# Patient Record
Sex: Female | Born: 1970 | Race: White | Hispanic: No | Marital: Single | Smoking: Never smoker
Health system: Southern US, Community
[De-identification: ages and names within clinical notes are randomized; demographics above are authoritative.]

## PROBLEM LIST (undated history)

## (undated) DIAGNOSIS — K760 Fatty (change of) liver, not elsewhere classified: Secondary | ICD-10-CM

## (undated) DIAGNOSIS — N809 Endometriosis, unspecified: Secondary | ICD-10-CM

## (undated) DIAGNOSIS — G43909 Migraine, unspecified, not intractable, without status migrainosus: Secondary | ICD-10-CM

## (undated) DIAGNOSIS — F419 Anxiety disorder, unspecified: Secondary | ICD-10-CM

## (undated) DIAGNOSIS — J101 Influenza due to other identified influenza virus with other respiratory manifestations: Secondary | ICD-10-CM

## (undated) DIAGNOSIS — A419 Sepsis, unspecified organism: Secondary | ICD-10-CM

## (undated) DIAGNOSIS — T7840XA Allergy, unspecified, initial encounter: Secondary | ICD-10-CM

## (undated) DIAGNOSIS — G4485 Primary stabbing headache: Secondary | ICD-10-CM

## (undated) HISTORY — PX: TYMPANOSTOMY TUBE PLACEMENT: SHX32

## (undated) HISTORY — DX: Sepsis, unspecified organism: A41.9

## (undated) HISTORY — DX: Fatty (change of) liver, not elsewhere classified: K76.0

## (undated) HISTORY — DX: Allergy, unspecified, initial encounter: T78.40XA

## (undated) HISTORY — DX: Anxiety disorder, unspecified: F41.9

## (undated) HISTORY — PX: SIGMOIDOSCOPY: SUR1295

## (undated) HISTORY — DX: Primary stabbing headache: G44.85

## (undated) HISTORY — DX: Influenza due to other identified influenza virus with other respiratory manifestations: J10.1

## (undated) HISTORY — DX: Migraine, unspecified, not intractable, without status migrainosus: G43.909

## (undated) HISTORY — PX: BREAST SURGERY: SHX581

## (undated) HISTORY — PX: OTHER SURGICAL HISTORY: SHX169

## (undated) HISTORY — PX: COLONOSCOPY: SHX174

## (undated) HISTORY — DX: Endometriosis, unspecified: N80.9

---

## 1992-02-25 HISTORY — PX: BREAST EXCISIONAL BIOPSY: SUR124

## 2013-01-26 ENCOUNTER — Other Ambulatory Visit (HOSPITAL_COMMUNITY)
Admission: RE | Admit: 2013-01-26 | Discharge: 2013-01-26 | Disposition: A | Discharge status: Home / Self Care | Payer: BC Managed Care – PPO | Source: Ambulatory Visit | Attending: Internal Medicine | Admitting: Internal Medicine

## 2013-01-26 DIAGNOSIS — Z1151 Encounter for screening for human papillomavirus (HPV): Secondary | ICD-10-CM | POA: Insufficient documentation

## 2013-01-26 DIAGNOSIS — Z01419 Encounter for gynecological examination (general) (routine) without abnormal findings: Secondary | ICD-10-CM | POA: Insufficient documentation

## 2013-10-10 ENCOUNTER — Other Ambulatory Visit: Payer: Self-pay | Admitting: Internal Medicine

## 2013-10-10 DIAGNOSIS — R1011 Right upper quadrant pain: Secondary | ICD-10-CM

## 2013-10-11 ENCOUNTER — Ambulatory Visit
Admission: RE | Admit: 2013-10-11 | Discharge: 2013-10-11 | Disposition: A | Discharge status: Home / Self Care | Payer: BC Managed Care – PPO | Source: Ambulatory Visit | Attending: Internal Medicine | Admitting: Internal Medicine

## 2013-10-11 ENCOUNTER — Other Ambulatory Visit: Payer: Self-pay | Admitting: Internal Medicine

## 2013-10-11 DIAGNOSIS — R1011 Right upper quadrant pain: Secondary | ICD-10-CM

## 2014-02-02 ENCOUNTER — Other Ambulatory Visit: Payer: Self-pay | Admitting: Internal Medicine

## 2014-02-02 ENCOUNTER — Other Ambulatory Visit (HOSPITAL_COMMUNITY)
Admission: RE | Admit: 2014-02-02 | Discharge: 2014-02-02 | Disposition: A | Discharge status: Home / Self Care | Payer: BC Managed Care – PPO | Source: Ambulatory Visit | Attending: Internal Medicine | Admitting: Internal Medicine

## 2014-02-02 DIAGNOSIS — Z1151 Encounter for screening for human papillomavirus (HPV): Secondary | ICD-10-CM | POA: Diagnosis present

## 2014-02-02 DIAGNOSIS — Z01419 Encounter for gynecological examination (general) (routine) without abnormal findings: Secondary | ICD-10-CM | POA: Insufficient documentation

## 2014-02-06 LAB — CYTOLOGY - PAP

## 2014-02-15 ENCOUNTER — Ambulatory Visit
Admission: RE | Admit: 2014-02-15 | Discharge: 2014-02-15 | Disposition: A | Discharge status: Home / Self Care | Payer: BC Managed Care – PPO | Source: Ambulatory Visit | Attending: Otolaryngology | Admitting: Otolaryngology

## 2014-02-15 ENCOUNTER — Other Ambulatory Visit: Payer: Self-pay | Admitting: Otolaryngology

## 2014-02-15 DIAGNOSIS — R51 Headache: Principal | ICD-10-CM

## 2014-02-15 DIAGNOSIS — R519 Headache, unspecified: Secondary | ICD-10-CM

## 2014-02-15 MED ORDER — GADOBENATE DIMEGLUMINE 529 MG/ML IV SOLN
12.0000 mL | Freq: Once | INTRAVENOUS | Status: AC | PRN
  Administered 2014-02-15: 12 mL via INTRAVENOUS

## 2014-04-11 ENCOUNTER — Encounter (HOSPITAL_COMMUNITY): Payer: Self-pay | Admitting: Emergency Medicine

## 2014-04-11 ENCOUNTER — Emergency Department (HOSPITAL_COMMUNITY): Payer: 59

## 2014-04-11 ENCOUNTER — Inpatient Hospital Stay (HOSPITAL_COMMUNITY)
Admission: EM | Admit: 2014-04-11 | Discharge: 2014-04-12 | DRG: 872 | Disposition: A | Discharge status: Home / Self Care | Payer: 59 | Attending: Internal Medicine | Admitting: Internal Medicine

## 2014-04-11 DIAGNOSIS — R509 Fever, unspecified: Secondary | ICD-10-CM | POA: Diagnosis not present

## 2014-04-11 DIAGNOSIS — L539 Erythematous condition, unspecified: Secondary | ICD-10-CM | POA: Diagnosis present

## 2014-04-11 DIAGNOSIS — Z885 Allergy status to narcotic agent status: Secondary | ICD-10-CM

## 2014-04-11 DIAGNOSIS — J101 Influenza due to other identified influenza virus with other respiratory manifestations: Secondary | ICD-10-CM | POA: Diagnosis present

## 2014-04-11 DIAGNOSIS — A419 Sepsis, unspecified organism: Secondary | ICD-10-CM

## 2014-04-11 DIAGNOSIS — IMO0001 Reserved for inherently not codable concepts without codable children: Secondary | ICD-10-CM | POA: Insufficient documentation

## 2014-04-11 HISTORY — DX: Sepsis, unspecified organism: A41.9

## 2014-04-11 LAB — CBC WITH DIFFERENTIAL/PLATELET
BASOS PCT: 0 % (ref 0–1)
Basophils Absolute: 0 10*3/uL (ref 0.0–0.1)
Eosinophils Absolute: 0 10*3/uL (ref 0.0–0.7)
Eosinophils Relative: 0 % (ref 0–5)
HCT: 35.8 % — ABNORMAL LOW (ref 36.0–46.0)
HEMOGLOBIN: 12.5 g/dL (ref 12.0–15.0)
LYMPHS ABS: 0.7 10*3/uL (ref 0.7–4.0)
Lymphocytes Relative: 16 % (ref 12–46)
MCH: 30 pg (ref 26.0–34.0)
MCHC: 34.9 g/dL (ref 30.0–36.0)
MCV: 85.9 fL (ref 78.0–100.0)
MONO ABS: 0.3 10*3/uL (ref 0.1–1.0)
Monocytes Relative: 7 % (ref 3–12)
NEUTROS ABS: 3.4 10*3/uL (ref 1.7–7.7)
Neutrophils Relative %: 77 % (ref 43–77)
Platelets: 181 10*3/uL (ref 150–400)
RBC: 4.17 MIL/uL (ref 3.87–5.11)
RDW: 12.2 % (ref 11.5–15.5)
WBC: 4.4 10*3/uL (ref 4.0–10.5)

## 2014-04-11 LAB — URINALYSIS, ROUTINE W REFLEX MICROSCOPIC
Bilirubin Urine: NEGATIVE
GLUCOSE, UA: NEGATIVE mg/dL
Leukocytes, UA: NEGATIVE
NITRITE: NEGATIVE
PH: 7.5 (ref 5.0–8.0)
Protein, ur: NEGATIVE mg/dL
SPECIFIC GRAVITY, URINE: 1.007 (ref 1.005–1.030)
Urobilinogen, UA: 0.2 mg/dL (ref 0.0–1.0)

## 2014-04-11 LAB — COMPREHENSIVE METABOLIC PANEL
ALK PHOS: 49 U/L (ref 39–117)
ALT: 15 U/L (ref 0–35)
ANION GAP: 9 (ref 5–15)
AST: 23 U/L (ref 0–37)
Albumin: 4.3 g/dL (ref 3.5–5.2)
BILIRUBIN TOTAL: 1.3 mg/dL — AB (ref 0.3–1.2)
CO2: 24 mmol/L (ref 19–32)
Calcium: 9 mg/dL (ref 8.4–10.5)
Chloride: 103 mmol/L (ref 96–112)
Creatinine, Ser: 1.12 mg/dL — ABNORMAL HIGH (ref 0.50–1.10)
GFR calc Af Amer: 69 mL/min — ABNORMAL LOW (ref >90)
GFR calc non Af Amer: 60 mL/min — ABNORMAL LOW (ref >90)
GLUCOSE: 104 mg/dL — AB (ref 70–99)
Potassium: 3.7 mmol/L (ref 3.5–5.1)
SODIUM: 136 mmol/L (ref 135–145)
TOTAL PROTEIN: 7.5 g/dL (ref 6.0–8.3)

## 2014-04-11 LAB — APTT: aPTT: 35 seconds (ref 24–37)

## 2014-04-11 LAB — PROTIME-INR
INR: 1.21 (ref 0.00–1.49)
Prothrombin Time: 15.5 seconds — ABNORMAL HIGH (ref 11.6–15.2)

## 2014-04-11 LAB — I-STAT CG4 LACTIC ACID, ED
LACTIC ACID, VENOUS: 0.99 mmol/L (ref 0.5–2.0)
Lactic Acid, Venous: 2.79 mmol/L (ref 0.5–2.0)

## 2014-04-11 LAB — INFLUENZA PANEL BY PCR (TYPE A & B)
H1N1FLUPCR: NOT DETECTED
INFLBPCR: POSITIVE — AB
Influenza A By PCR: NEGATIVE

## 2014-04-11 LAB — URINE MICROSCOPIC-ADD ON

## 2014-04-11 LAB — RAPID STREP SCREEN (MED CTR MEBANE ONLY): Streptococcus, Group A Screen (Direct): NEGATIVE

## 2014-04-11 LAB — LIPASE, BLOOD: Lipase: 31 U/L (ref 11–59)

## 2014-04-11 LAB — LACTIC ACID, PLASMA: LACTIC ACID, VENOUS: 1 mmol/L (ref 0.5–2.0)

## 2014-04-11 LAB — PROCALCITONIN: Procalcitonin: <0.1 ng/mL

## 2014-04-11 MED ORDER — PIPERACILLIN-TAZOBACTAM 3.375 G IVPB 30 MIN
3.3750 g | Freq: Once | INTRAVENOUS | Status: AC
  Administered 2014-04-11: 3.375 g via INTRAVENOUS
  Filled 2014-04-11: qty 50

## 2014-04-11 MED ORDER — METHYLPREDNISOLONE SODIUM SUCC 125 MG IJ SOLR
125.0000 mg | Freq: Once | INTRAMUSCULAR | Status: AC
  Administered 2014-04-11: 125 mg via INTRAVENOUS
  Filled 2014-04-11: qty 2

## 2014-04-11 MED ORDER — SODIUM CHLORIDE 0.9 % IV SOLN
INTRAVENOUS | Status: DC
  Administered 2014-04-11 – 2014-04-12 (×2): via INTRAVENOUS

## 2014-04-11 MED ORDER — ACETAMINOPHEN 325 MG PO TABS
650.0000 mg | ORAL_TABLET | Freq: Four times a day (QID) | ORAL | Status: DC | PRN
  Administered 2014-04-11 – 2014-04-12 (×3): 650 mg via ORAL
  Filled 2014-04-11 (×3): qty 2

## 2014-04-11 MED ORDER — SODIUM CHLORIDE 0.9 % IV SOLN: INTRAVENOUS | Status: DC

## 2014-04-11 MED ORDER — ACETAMINOPHEN 650 MG RE SUPP: 650.0000 mg | Freq: Four times a day (QID) | RECTAL | Status: DC | PRN

## 2014-04-11 MED ORDER — VANCOMYCIN HCL IN DEXTROSE 1-5 GM/200ML-% IV SOLN
1000.0000 mg | Freq: Once | INTRAVENOUS | Status: AC
  Administered 2014-04-11: 1000 mg via INTRAVENOUS
  Filled 2014-04-11: qty 200

## 2014-04-11 MED ORDER — DIPHENHYDRAMINE HCL 50 MG/ML IJ SOLN
50.0000 mg | Freq: Once | INTRAMUSCULAR | Status: AC
Start: 2014-04-11 — End: 2014-04-11
  Administered 2014-04-11: 50 mg via INTRAVENOUS
  Filled 2014-04-11: qty 1

## 2014-04-11 MED ORDER — ACETAMINOPHEN 325 MG PO TABS
650.0000 mg | ORAL_TABLET | Freq: Once | ORAL | Status: AC
  Administered 2014-04-11: 650 mg via ORAL
  Filled 2014-04-11: qty 2

## 2014-04-11 MED ORDER — FAMOTIDINE IN NACL 20-0.9 MG/50ML-% IV SOLN
20.0000 mg | Freq: Once | INTRAVENOUS | Status: AC
  Administered 2014-04-11: 20 mg via INTRAVENOUS
  Filled 2014-04-11: qty 50

## 2014-04-11 MED ORDER — GUAIFENESIN-DM 100-10 MG/5ML PO SYRP
5.0000 mL | ORAL_SOLUTION | ORAL | Status: DC | PRN
  Administered 2014-04-12: 5 mL via ORAL
  Filled 2014-04-11 (×2): qty 5

## 2014-04-11 MED ORDER — ONDANSETRON HCL 4 MG/2ML IJ SOLN: 4.0000 mg | Freq: Four times a day (QID) | INTRAMUSCULAR | Status: DC | PRN

## 2014-04-11 MED ORDER — SODIUM CHLORIDE 0.9 % IV BOLUS (SEPSIS)
1000.0000 mL | INTRAVENOUS | Status: AC
  Administered 2014-04-11 (×2): 1000 mL via INTRAVENOUS

## 2014-04-11 MED ORDER — PIPERACILLIN-TAZOBACTAM 3.375 G IVPB
3.3750 g | Freq: Three times a day (TID) | INTRAVENOUS | Status: DC
  Administered 2014-04-11 – 2014-04-12 (×2): 3.375 g via INTRAVENOUS
  Filled 2014-04-11 (×5): qty 50

## 2014-04-11 MED ORDER — VANCOMYCIN HCL IN DEXTROSE 750-5 MG/150ML-% IV SOLN
750.0000 mg | Freq: Two times a day (BID) | INTRAVENOUS | Status: DC
  Administered 2014-04-12: 750 mg via INTRAVENOUS
  Filled 2014-04-11 (×2): qty 150

## 2014-04-11 MED ORDER — OSELTAMIVIR PHOSPHATE 75 MG PO CAPS
75.0000 mg | ORAL_CAPSULE | Freq: Two times a day (BID) | ORAL | Status: DC
  Administered 2014-04-11 – 2014-04-12 (×2): 75 mg via ORAL
  Filled 2014-04-11 (×4): qty 1

## 2014-04-11 MED ORDER — SODIUM CHLORIDE 0.9 % IJ SOLN
3.0000 mL | Freq: Two times a day (BID) | INTRAMUSCULAR | Status: DC
  Administered 2014-04-12: 3 mL via INTRAVENOUS

## 2014-04-11 MED ORDER — ENOXAPARIN SODIUM 40 MG/0.4ML ~~LOC~~ SOLN
40.0000 mg | SUBCUTANEOUS | Status: DC
  Filled 2014-04-11 (×2): qty 0.4

## 2014-04-11 MED ORDER — ONDANSETRON HCL 4 MG PO TABS: 4.0000 mg | ORAL_TABLET | Freq: Four times a day (QID) | ORAL | Status: DC | PRN

## 2014-04-11 NOTE — ED Notes (Signed)
Phlebotomy at bedside.

## 2014-04-11 NOTE — Progress Notes (Addendum)
ANTIBIOTIC CONSULT NOTE - INITIAL  Pharmacy Consult for Vancomycin and Zosyn Indication: rule out sepsis  Allergies  Allergen Reactions  . Codeine     Patient Measurements: Height: 5' (152.4 cm) Weight: 132 lb (59.875 kg) IBW/kg (Calculated) : 45.5 Adjusted Body Weight:   Vital Signs: Temp: 101.2 F (38.4 C) (02/16 1202) Temp Source: Oral (02/16 1202) BP: 113/66 mmHg (02/16 1300) Pulse Rate: 109 (02/16 1300) Intake/Output from previous day:   Intake/Output from this shift:    Labs:  Recent Labs  04/11/14 1209  WBC 4.4  HGB 12.5  PLT 181   CrCl cannot be calculated (Patient has no serum creatinine result on file.). No results for input(s): VANCOTROUGH, VANCOPEAK, VANCORANDOM, GENTTROUGH, GENTPEAK, GENTRANDOM, TOBRATROUGH, TOBRAPEAK, TOBRARND, AMIKACINPEAK, AMIKACINTROU, AMIKACIN in the last 72 hours.   Microbiology: Recent Results (from the past 720 hour(s))  Rapid strep screen     Status: None   Collection Time: 04/11/14 12:04 PM  Result Value Ref Range Status   Streptococcus, Group A Screen (Direct) NEGATIVE NEGATIVE Final    Comment: (NOTE) A Rapid Antigen test may result negative if the antigen level in the sample is below the detection level of this test. The FDA has not cleared this test as a stand-alone test therefore the rapid antigen negative result has reflexed to a Group A Strep culture.     Medical History: History reviewed. No pertinent past medical history.  Medications:  Scheduled:   Assessment: 44yo female presenting with body aches, fever, cough & sore throat- to start Vancomycin and Zosyn for R/O sepsis.  Vancomycin 1000mg  IV and Zosyn 3.375g IV have both been ordered as first doses.  Cr 1.12 LA 2.79 WBC 4.4  Goal of Therapy:  Vancomycin trough level 15-20 mcg/ml  Plan:  Zosyn 3.375g IV q8, infuse over 4hr Vancomycin 750mg  IV q12 F/U culture results   Marisue HumbleKendra Karena Kinker, PharmD Clinical Pharmacist [facility]

## 2014-04-11 NOTE — ED Notes (Signed)
Phlebotomy notified of need for Blood cultures. Xray at bedside.

## 2014-04-11 NOTE — ED Notes (Signed)
Dr. Pfeiffer back at the bedside.  

## 2014-04-11 NOTE — ED Notes (Signed)
Level 2 Sepsis activated @ 1305

## 2014-04-11 NOTE — ED Notes (Signed)
Lactic acid results shown to Ward, DO and given to the charge nurse

## 2014-04-11 NOTE — ED Provider Notes (Signed)
CSN: [id number]     Arrival date & time 04/11/14  1153 History   First MD Initiated Contact with Patient 04/11/14 1256     Chief Complaint  Patient presents with  . Generalized Body Aches  . Fever  . Cough     (Consider location/radiation/quality/duration/timing/severity/associated sxs/prior Treatment) HPI The patient is sent from Dr. Idelle Crouch office from Day. He called and reported concern for sepsis with the patient having hypotension and fever. The patient reports that she was first sick about 2 weeks ago. She reports she had abdominal pain for about a week but did not develop any vomiting. She reports she had a couple episodes of diarrhea but not any significant amount. She reports she was able to continue to eat and drink. She felt better by last week and it seemed to resolve. Now she reports about 3 days ago she started developing a sore throat and then last night harsh cough nonproductive of sputum. She reports now that she aches all over and has had a fever up to 103 and chills. She has not developed any vomiting or diarrhea. History reviewed. No pertinent past medical history. History reviewed. No pertinent past surgical history. History reviewed. No pertinent family history. History  Substance Use Topics  . Smoking status: Never Smoker   . Smokeless tobacco: Not on file  . Alcohol Use: Yes     Comment: occ   OB History    No data available     Review of Systems 10 Systems reviewed and are negative for acute change except as noted in the HPI.    Allergies  Codeine  Home Medications   Prior to Admission medications   Medication Sig Start Date End Date Taking? Authorizing Provider  naproxen (NAPROSYN) 375 MG tablet Take 375 mg by mouth 2 (two) times daily as needed for mild pain.   Yes Historical Provider, MD   BP 95/57 mmHg  Pulse 98  Temp(Src) 101.2 F (38.4 C) (Oral)  Resp 12  Ht 5' (1.524 m)  Wt 132 lb (59.875 kg)  BMI 25.78 kg/m2  SpO2 99%  LMP  04/11/2014 Physical Exam  Constitutional: She is oriented to person, place, and time. She appears well-developed and well-nourished.  The patient appears mildly ill and uncomfortable. She is alert and appropriate. She does not have any respiratory distress. Her color is good.  HENT:  Head: Normocephalic and atraumatic.  Bilateral TMs are normal. Throat mild erythema but no exudate or significant tonsillar enlargement. Neck is supple.  Eyes: EOM are normal. Pupils are equal, round, and reactive to light.  Neck: Neck supple.  Cardiovascular: Regular rhythm, normal heart sounds and intact distal pulses.   Tachycardia  Pulmonary/Chest: Effort normal and breath sounds normal.  Abdominal: Soft. Bowel sounds are normal. She exhibits no distension. There is no tenderness.  Musculoskeletal: Normal range of motion. She exhibits no edema.  Neurological: She is alert and oriented to person, place, and time. She has normal strength. Coordination normal. GCS eye subscore is 4. GCS verbal subscore is 5. GCS motor subscore is 6.  Skin: Skin is warm, dry and intact.  Refill is slightly delayed in the feet. There are no rashes over any of the body surfaces.  Psychiatric: She has a normal mood and affect.    ED Course  Procedures (including critical care time) Labs Review Labs Reviewed  CBC WITH DIFFERENTIAL/PLATELET - Abnormal; Notable for the following:    HCT 35.8 (*)    All other components within  normal limits  COMPREHENSIVE METABOLIC PANEL - Abnormal; Notable for the following:    Glucose, Bld 104 (*)    BUN <5 (*)    Creatinine, Ser 1.12 (*)    Total Bilirubin 1.3 (*)    GFR calc non Af Amer 60 (*)    GFR calc Af Amer 69 (*)    All other components within normal limits  URINALYSIS, ROUTINE W REFLEX MICROSCOPIC - Abnormal; Notable for the following:    Hgb urine dipstick LARGE (*)    Ketones, ur >80 (*)    All other components within normal limits  I-STAT CG4 LACTIC ACID, ED - Abnormal;  Notable for the following:    Lactic Acid, Venous 2.79 (*)    All other components within normal limits  RAPID STREP SCREEN  CULTURE, GROUP A STREP  CULTURE, BLOOD (ROUTINE X 2)  CULTURE, BLOOD (ROUTINE X 2)  URINE CULTURE  APTT  LIPASE, BLOOD  URINE MICROSCOPIC-ADD ON  INFLUENZA PANEL BY PCR (TYPE A & B, H1N1)  I-STAT CG4 LACTIC ACID, ED    Imaging Review Dg Chest Port 1 View  04/11/2014   CLINICAL DATA:  Cough and fever for 2 days  EXAM: PORTABLE CHEST - 1 VIEW  COMPARISON:  None.  FINDINGS: The heart size and mediastinal contours are within normal limits. Both lungs are clear. The visualized skeletal structures are unremarkable.  IMPRESSION: No active disease.   Electronically Signed   By: Alcide CleverMark  Lukens M.D.   On: 04/11/2014 13:33     EKG Interpretation None     Recheck 15:00. The patient's general condition is stable she is alert with no respiratory distress. There has been no decline in her general condition. She has developed erythema after the administration of antibiotics. She perceives her head and back to be very itchy and there is diffuse erythema over the backs of the shoulders and her back. Arms and anterior aspect of the body and legs are spared.  CRITICAL CARE Performed by: Arby BarrettePfeiffer, Stephonie Wilcoxen   Total critical care time: 40  Critical care time was exclusive of separately billable procedures and treating other patients.  Critical care was necessary to treat or prevent imminent or life-threatening deterioration.  Critical care was time spent personally by me on the following activities: development of treatment plan with patient and/or surrogate as well as nursing, discussions with consultants, evaluation of patient's response to treatment, examination of patient, obtaining history from patient or surrogate, ordering and performing treatments and interventions, ordering and review of laboratory studies, ordering and review of radiographic studies, pulse oximetry and  re-evaluation of patient's condition. MDM   Final diagnoses:  Sepsis, due to unspecified organism   The patient's clinical condition remains stable with a clear mental status and no respiratory distress. She appears to have developed allergic reaction to either Zosyn or vancomycin, or possibly the beginning of red man syndrome. She was treated with Solu-Medrol, Benadryl and Pepcid. She will be admitted to telemetry for ongoing treatment and monitoring.   Arby BarretteMarcy Tomoko Sandra, MD 04/11/14 [phone]

## 2014-04-11 NOTE — ED Notes (Signed)
Pt sts body aches, fever, cough and sore throat; pt sts had stomach virus last week; pt hyperventilating and c/o generalized numbness

## 2014-04-11 NOTE — H&P (Addendum)
[facility] History and Physical  Andres Labrumudrey Hickmon KGM:[id number]:[id number] DOB: 06/23/1970 DOA: 04/11/2014  Referring physician:  Dr Clarice PolePfeifer  PCP: Dr Molly Maduroobert gates  Chief Complaint:  Sent from PCP office for sepsis w/up   HPI:  44 year old female with no past medical history was sent from PCP office to the ED for sepsis workup. Patient reports having stomach ache for almost 1 week with abdominal cramping and bloating with loose bowel movements until 5 days and subsided on its on. 3 days back she developed sore throat with nonproductive cough and congestion. She also reports postnasal drip and poor appetite. Denies headache or blurred vision, dysphagia, nausea, vomiting, chest pain, palpitations, shortness of breath, dysuria or diarrhea. Denies joint pains or vaginal discharge. She reports having subjective fevers with chills yesterday and this morning when she recorded her temperature it was 1026F. He also had some mid to low back pain yesterday with generalized malaise. She went to the PCP office this morning and was found to be febrile and tachycardic and was referred to the ED. She denies any recent travel or sick contact reports that her dog was sick with diarrhea last week as well. Reports monogamous  Relationship for past 12 years. No hx of STDs.  Course in the ED  patient was febrile to 101.26F, tachycardic up to 117, respiratory rate of 222 and low normal blood pressure of 95/57 mmHg. O2 sats were normal on room air. Blood work done showed normal CBC and chemistry except for creatinine 1.12. LFTs were normal. Chest x-ray was unremarkable. UA was negative for UTI. Lactic acid was elevated to 2.79. Patient meets criteria for sepsis.  She was given 2 L IV normal saline in the ED. Blood cultures were ordered and patient given empiric IV vancomycin and Zosyn. Shortly after receiving the IV antibiotic patient developed diffuse erythema over the upper back with itching. She was then given 1.5 mg IV  Solu-Medrol, famotidine and IV Benadryl after which her symptoms improved. Hospitalist admission requested to telemetry.   Review of Systems:  Constitutional:  fever, chills, appetite change and fatigue.  HEENT: Denies photophobia, eye pain, redness, hearing loss, ear pain,  congestion++, sore throat++,  denies rhinorrhea, sneezing, mouth sores, trouble swallowing, neck pain, neck stiffness and tinnitus.   Respiratory: cough++, Denies SOB, DOE,  chest tightness,  and wheezing.   Cardiovascular: Denies chest pain, palpitations and leg swelling.  Gastrointestinal: Denies nausea, vomiting, abdominal pain, diarrhea, constipation, blood in stool and abdominal distention.  Genitourinary: Denies dysuria, urgency, frequency, hematuria, flank pain and difficulty urinating.  Endocrine: Denies: hot or cold intolerance, polyuria, polydipsia. Musculoskeletal:  myalgias++,  denies back pain, joint swelling, arthralgias and gait problem.  Skin: Denies pallor, rash and wound.  Neurological: Denies dizziness, seizures, syncope, weakness, light-headedness, numbness and headaches.  Hematological: Denies adenopathy.  Psychiatric/Behavioral: Denies confusion  History reviewed. No pertinent past medical history.  History reviewed. No pertinent past surgical history.   Family history: No history of heart disease, hypertension or diabetes in the family.  Social History:  reports that she has never smoked. She does not have any smokeless tobacco history on file. She reports that she drinks alcohol. She reports using vaporized marijuana   Allergies  Allergen Reactions  . Codeine Nausea And Vomiting      Prior to Admission medications   Medication Sig Start Date End Date Taking? Authorizing Provider  naproxen (NAPROSYN) 375 MG tablet Take 375 mg by mouth 2 (two) times daily as needed for mild pain.  Yes Historical Provider, MD     Physical Exam:  Filed Vitals:   04/11/14 1315 04/11/14 1330 04/11/14  1430 04/11/14 1445  BP: 127/111 114/63 100/58 95/57  Pulse: 102 110 98 98  Temp:      TempSrc:      Resp: Height:      Weight:      SpO2: 100% 97% 99% 99%    Constitutional: Vital signs reviewed.  Middle aged female lying in bed in no acute distress but appears fatigued HEENT: no pallor, no icterus, moist oral mucosa, no cervical lymphadenopathy, mild posterior pharyngeal erythema without exudates, no oral thrush Cardiovascular: RRR, S1 normal, S2 normal, no MRG Chest: CTAB, no wheezes, rales, or rhonchi Abdominal: Soft. Non-tender, non-distended, bowel sounds are normal, no masses, organomegaly, or guarding present.  GU: no CVA tenderness Ext: warm, no edema Neurological: Alert and oriented, non focal  Labs on Admission:  Basic Metabolic Panel:  Recent Labs Lab 04/11/14 1209  NA 136  K 3.7  CL 103  CO2 24  GLUCOSE 104*  BUN <5*  CREATININE 1.12*  CALCIUM 9.0   Liver Function Tests:  Recent Labs Lab 04/11/14 1209  AST 23  ALT 15  ALKPHOS 49  BILITOT 1.3*  PROT 7.5  ALBUMIN 4.3    Recent Labs Lab 04/11/14 1344  LIPASE 31   No results for input(s): AMMONIA in the last 168 hours. CBC:  Recent Labs Lab 04/11/14 1209  WBC 4.4  NEUTROABS 3.4  HGB 12.5  HCT 35.8*  MCV 85.9  PLT 181   Cardiac Enzymes: No results for input(s): CKTOTAL, CKMB, CKMBINDEX, TROPONINI in the last 168 hours. BNP: Invalid input(s): POCBNP CBG: No results for input(s): GLUCAP in the last 168 hours.  Radiological Exams on Admission: Dg Chest Port 1 View  04/11/2014   CLINICAL DATA:  Cough and fever for 2 days  EXAM: PORTABLE CHEST - 1 VIEW  COMPARISON:  None.  FINDINGS: The heart size and mediastinal contours are within normal limits. Both lungs are clear. The visualized skeletal structures are unremarkable.  IMPRESSION: No active disease.   Electronically Signed   By: Alcide Clever M.D.   On: 04/11/2014 13:33    EKG: None  Assessment/Plan Principal  Problem:    Sepsis No clear source of infection. Initial workup including UA and chest x-ray unremarkable. WBC normal. Possibly related to an acute viral URI. Blood cultures ordered in the ED. Patient given empiric IV vancomycin and Zosyn but developed a diffuse erythematous rash on the back. Could be related to vancomycin. I would monitor her on empiric IV Zosyn alone. -Check repeat lactic acid. IV hydration with normal saline 1 25 mL per hour. (Received 2 L normal saline bolus in the ED) -Check procalcitonin -Rapid strep negative. -Check flu PCR. -Supportive care with Tylenol, IV fluids , antitussives and antiemetics.   Rash Developed erythematous rash over the upper back and neck Shortly after receiving IV antibiotics. ? Red man syndrome. Had resolved during my evaluation after receiving Solu-Medrol, Benadryl and Pepcid.  Diet:cardiac  DVT prophylaxis: sq lovenox   Code Status: Full code  Family Communication: None at bedside  Disposition Plan: Admit to telemetry  Chivonne Rascon, [facility] Pager [phone]  Total time spent on admission :70 minutes  If 7PM-7AM, please contact night-coverage [email or web address] Password [email or web address] 04/11/2014, 3:20 PM

## 2014-04-11 NOTE — ED Notes (Signed)
Dr. Pfeiffer at bedside   

## 2014-04-12 DIAGNOSIS — A419 Sepsis, unspecified organism: Principal | ICD-10-CM

## 2014-04-12 DIAGNOSIS — J101 Influenza due to other identified influenza virus with other respiratory manifestations: Secondary | ICD-10-CM

## 2014-04-12 HISTORY — DX: Influenza due to other identified influenza virus with other respiratory manifestations: J10.1

## 2014-04-12 LAB — CBC
HCT: 31.6 % — ABNORMAL LOW (ref 36.0–46.0)
Hemoglobin: 11.1 g/dL — ABNORMAL LOW (ref 12.0–15.0)
MCH: 30.5 pg (ref 26.0–34.0)
MCHC: 35.1 g/dL (ref 30.0–36.0)
MCV: 86.8 fL (ref 78.0–100.0)
PLATELETS: 151 10*3/uL (ref 150–400)
RBC: 3.64 MIL/uL — ABNORMAL LOW (ref 3.87–5.11)
RDW: 12.4 % (ref 11.5–15.5)
WBC: 4.5 10*3/uL (ref 4.0–10.5)

## 2014-04-12 LAB — BASIC METABOLIC PANEL
Anion gap: 9 (ref 5–15)
BUN: 8 mg/dL (ref 6–23)
CO2: 22 mmol/L (ref 19–32)
Calcium: 8.1 mg/dL — ABNORMAL LOW (ref 8.4–10.5)
Chloride: 110 mmol/L (ref 96–112)
Creatinine, Ser: 0.81 mg/dL (ref 0.50–1.10)
GFR calc Af Amer: >90 mL/min (ref >90)
GFR calc non Af Amer: 88 mL/min — ABNORMAL LOW (ref >90)
GLUCOSE: 139 mg/dL — AB (ref 70–99)
Potassium: 3.7 mmol/L (ref 3.5–5.1)
SODIUM: 141 mmol/L (ref 135–145)

## 2014-04-12 LAB — URINE CULTURE: Colony Count: >=100000

## 2014-04-12 MED ORDER — PHENOL 1.4 % MT LIQD
1.0000 | OROMUCOSAL | Status: DC | PRN
  Filled 2014-04-12: qty 177

## 2014-04-12 MED ORDER — OSELTAMIVIR PHOSPHATE 75 MG PO CAPS: 75.0000 mg | ORAL_CAPSULE | Freq: Two times a day (BID) | ORAL | Status: DC

## 2014-04-12 NOTE — Progress Notes (Signed)
PROGRESS NOTE  Jill Melendez ZTI:[id number] DOB: [birth year] DOA: 04/11/2014 PCP: Henrine Screws, MD  HPI/Subjective: Jill Melendez is a 44 year old caucasian female with a past medical history of IBS and endometriosis who was admitted to internal medicine service yesterday (04/11/14) after presenting to the [facility] with influenza like symptoms and meeting SIRS criteria. Patient reports 2 week history of intermittent diarrhea and abdominal pain with progression to fever (max of 103F), headache, myalgias, rhinorrhea, dry cough, and pharyngitis beginning 3 days ago. Patient did not receive a flu vaccination this year and reports possible sick contacts at her place of employment.   In the ER, patient was called a code sepsis and given Vancomycin and Zosyn. After infusion, patient developed a pruritic and erythematous rash which was successfully controlled with Solu-Medrol, famotidine, and benadryl. CXR was unremarkable, rapid strep was negative, and flu panel was positive for Influenza B. Blood, urine, and group A strep cultures are negative.  Today, based on positive influenza result, antibiotics were discontinued. Patient is feeling better but still experiencing symptoms including weakness, pharyngitis, cough, and myalgias. Diarrhea has subsided and her LBM was approximately 10:00am and described as solid. Expresses that tylenol has relieved majority of her pain and is now 1/10. Requested medication for pharyngitis which she has not yet received. This morning she had a low grade fever but all other vitals signs stable. Reports she has been ambulatory in her room throughout the morning without difficulty.  Review of Systems - History obtained from the patient General ROS: positive for  - fever and malaise ENT ROS: positive for - nasal congestion, nasal discharge, sneezing and sore throat negative for - hearing change, vertigo or visual changes Allergy and Immunology ROS: positive for - itchy/watery  eyes negative for - hives Respiratory ROS: positive for - cough negative for - hemoptysis, pleuritic pain or shortness of breath Cardiovascular ROS: negative for - chest pain, palpitations or shortness of breath Gastrointestinal ROS: negative for - abdominal pain, blood in stools, diarrhea, melena or nausea/vomiting Musculoskeletal ROS: positive for - muscle pain and muscular weakness negative for - gait disturbance Neurological ROS: positive for - headaches and weakness negative for - confusion, dizziness, numbness/tingling or visual changes Dermatological ROS: negative   Assessment/Plan:  Sepsis -Patient met SIRS criteria in ED with temperature and tachycardia -Patient has responded to IV fluids and anti-pyretics and vitals have since stabilized -Continue fluids and anti-pyretics    Influenza B  - as evidenced by positive flu panel as well as symptomatology of global myalgias, fever, cough, congestion - continue Tamiflu and IV fluids  - continue tylenol, zofran, and chloraseptic prn for symptom relief     DVT Prophylaxis:  Lovenox  Code Status: Full Family Communication: no family at bedside Disposition Plan: Patient will likely be discharged home once symptoms are controlled- will recheck this afternoon   Consultants:  N/A  Procedures:  N/A  Antibiotics: Anti-infectives    Start     Dose/Rate Route Frequency Ordered Stop   04/12/14 0300  vancomycin (VANCOCIN) IVPB 750 mg/150 ml premix  Status:  Discontinued     750 mg 150 mL/hr over 60 Minutes Intravenous Every 12 hours 04/11/14 1324 04/12/14 0849   04/11/14 2200  piperacillin-tazobactam (ZOSYN) IVPB 3.375 g  Status:  Discontinued     3.375 g 12.5 mL/hr over 240 Minutes Intravenous 3 times per day 04/11/14 1324 04/12/14 0849   04/11/14 2200  oseltamivir (TAMIFLU) capsule 75 mg     75 mg Oral 2  times daily 04/11/14 2013 04/16/14 2159   04/11/14 1315  piperacillin-tazobactam (ZOSYN) IVPB 3.375 g     3.375  g 100 mL/hr over 30 Minutes Intravenous  Once 04/11/14 1311 04/11/14 1437   04/11/14 1315  vancomycin (VANCOCIN) IVPB 1000 mg/200 mL premix     1,000 mg 200 mL/hr over 60 Minutes Intravenous  Once 04/11/14 1311 04/11/14 1507      Objective: Filed Vitals:   04/11/14 1821 04/11/14 2142 04/12/14 0533 04/12/14 0800  BP: 103/70 102/64 95/64   Pulse: 93 78 83   Temp: 98.1 F (36.7 C) 98.6 F (37 C) 98.4 F (36.9 C) 99.4 F (37.4 C)  TempSrc: Oral Oral Oral Oral  Resp: $Remo'18 18 18   'Pwkum$ Height: 5' (1.524 m)     Weight: 63.3 kg (139 lb 8.8 oz)     SpO2: 98% 97% 98%     Intake/Output Summary (Last 24 hours) at 04/12/14 1050 Last data filed at 04/12/14 0762  Gross per 24 hour  Intake 2145.83 ml  Output   2950 ml  Net -804.17 ml   Filed Weights   04/11/14 1202 04/11/14 1821  Weight: 59.875 kg (132 lb) 63.3 kg (139 lb 8.8 oz)    Exam: General: Well developed, well nourished caucasian female NAD, appears stated age. HEENT:  PERR, EOMI, Anicteic Sclera, MMM. No pharyngeal erythema or exudates  Neck: Supple, no masses, no lymphadenopathy Cardiovascular: RRR, S1 S2 auscultated, no rubs, murmurs or gallops.   Respiratory: Clear to auscultation bilaterally with equal chest rise. Mild expiratory wheeze but no rales or rhonchi. Abdomen: Soft, nontender, nondistended, + bowel sounds in all 4 quadrants Extremities: warm dry without cyanosis clubbing or edema.  Neuro: AAOx3, cranial nerves grossly intact. Strength 5/5 in upper and lower extremities. Patient speaking in full sentences with appropriate speech. Skin: Without rashes exudates or nodules.   Psych: Normal affect and demeanor with intact judgement and insight   Data Reviewed: Basic Metabolic Panel:  Recent Labs Lab 04/11/14 1209 04/12/14 0813  NA 136 141  K 3.7 3.7  CL 103 110  CO2 24 22  GLUCOSE 104* 139*  BUN <5* 8  CREATININE 1.12* 0.81  CALCIUM 9.0 8.1*   Liver Function Tests:  Recent Labs Lab 04/11/14 1209  AST  23  ALT 15  ALKPHOS 49  BILITOT 1.3*  PROT 7.5  ALBUMIN 4.3    Recent Labs Lab 04/11/14 1344  LIPASE 31   CBC:  Recent Labs Lab 04/11/14 1209 04/12/14 0813  WBC 4.4 4.5  NEUTROABS 3.4  --   HGB 12.5 11.1*  HCT 35.8* 31.6*  MCV 85.9 86.8  PLT 181 151    Recent Results (from the past 240 hour(s))  Rapid strep screen     Status: None   Collection Time: 04/11/14 12:04 PM  Result Value Ref Range Status   Streptococcus, Group A Screen (Direct) NEGATIVE NEGATIVE Final    Comment: (NOTE) A Rapid Antigen test may result negative if the antigen level in the sample is below the detection level of this test. The FDA has not cleared this test as a stand-alone test therefore the rapid antigen negative result has reflexed to a Group A Strep culture.   Blood Culture (routine x 2)     Status: None (Preliminary result)   Collection Time: 04/11/14  1:40 PM  Result Value Ref Range Status   Specimen Description BLOOD LEFT ANTECUBITAL  Final   Special Requests BOTTLES DRAWN AEROBIC AND ANAEROBIC 10CC  Final  Culture   Final           BLOOD CULTURE RECEIVED NO GROWTH TO DATE CULTURE WILL BE HELD FOR 5 DAYS BEFORE ISSUING A FINAL NEGATIVE REPORT Performed at [facility]    Report Status PENDING  Incomplete  Blood Culture (routine x 2)     Status: None (Preliminary result)   Collection Time: 04/11/14  1:50 PM  Result Value Ref Range Status   Specimen Description BLOOD HAND LEFT  Final   Special Requests BOTTLES DRAWN AEROBIC AND ANAEROBIC 5CC  Final   Culture   Final           BLOOD CULTURE RECEIVED NO GROWTH TO DATE CULTURE WILL BE HELD FOR 5 DAYS BEFORE ISSUING A FINAL NEGATIVE REPORT Performed at [facility]    Report Status PENDING  Incomplete     Studies: Dg Chest Port 1 View  04/11/2014   CLINICAL DATA:  Cough and fever for 2 days  EXAM: PORTABLE CHEST - 1 VIEW  COMPARISON:  None.  FINDINGS: The heart size and mediastinal contours are within normal  limits. Both lungs are clear. The visualized skeletal structures are unremarkable.  IMPRESSION: No active disease.   Electronically Signed   By: Inez Catalina M.D.   On: 04/11/2014 13:33    Scheduled Meds: . sodium chloride   Intravenous STAT  . enoxaparin (LOVENOX) injection  40 mg Subcutaneous Q24H  . oseltamivir  75 mg Oral BID  . sodium chloride  3 mL Intravenous Q12H   Continuous Infusions: . sodium chloride 125 mL/hr at 04/12/14 0845    Principal Problem:   Sepsis    Raspect, Erin, PA-S [facility] 04/12/2014, 10:50 AM   Addendum  Patient seen and examined, chart and data base reviewed.  I agree with the above assessment and plan.  For full details please see Mrs. Raspect, Erin, PA-S note.  This note is for educational purposes, for billing purposes please see my note.   Birdie Hopes, MD [facility] Pager: [phone] 04/15/2014, 12:07 PM

## 2014-04-12 NOTE — Discharge Summary (Signed)
Physician Discharge Summary  Jill Melendez UMP:[id number] DOB: 13-Aug-1970 DOA: 04/11/2014  PCP: Henrine Screws, MD  Admit date: 04/11/2014 Discharge date: 04/12/2014  Time spent: 40 minutes  Recommendations for Outpatient Follow-up:  1. Follow-up with PCP within 2 weeks.  Discharge Diagnoses:  Principal Problem:   Influenza due to influenza virus, type B Active Problems:   Sepsis   Discharge Condition: Stable  Diet recommendation: Regular  Filed Weights   04/11/14 1202 04/11/14 1821  Weight: 59.875 kg (132 lb) 63.3 kg (139 lb 8.8 oz)    History of present illness:  44 year old female with no past medical history was sent from PCP office to the ED for sepsis workup. Patient reports having stomach ache for almost 1 week with abdominal cramping and bloating with loose bowel movements until 5 days and subsided on its on. 3 days back she developed sore throat with nonproductive cough and congestion. She also reports postnasal drip and poor appetite. Denies headache or blurred vision, dysphagia, nausea, vomiting, chest pain, palpitations, shortness of breath, dysuria or diarrhea. Denies joint pains or vaginal discharge. She reports having subjective fevers with chills yesterday and this morning when she recorded her temperature it was 102F. He also had some mid to low back pain yesterday with generalized malaise. She went to the PCP office this morning and was found to be febrile and tachycardic and was referred to the ED. She denies any recent travel or sick contact reports that her dog was sick with diarrhea last week as well. Reports monogamous Relationship for past 12 years. No hx of STDs.  Hospital Course:   Influenza type B infection Patient presented to the hospital with generalized body aches and fever for 102, also she has a sore throat. Tested positive for influenza type B, started on Tamiflu. Patient felt better in the morning, she still having low-grade fever but she  elected to go home. Prescription for Tamiflu for 5 days was given. Patient instructed to keep herself hydrated, Tylenol for fevers.  Sepsis Patient met sepsis criteria with fever of 102 and and tachycardia with heart rate of 103. This is likely secondary to influenza infection. Patient felt much better, treated with IV fluids, sepsis physiology resolved prior to discharge.  Procedures:  None  Consultations:  None  Discharge Exam: Filed Vitals:   04/12/14 0800  BP:   Pulse:   Temp: 99.4 F (37.4 C)  Resp:    General: Alert and awake, oriented x3, not in any acute distress. HEENT: anicteric sclera, pupils reactive to light and accommodation, EOMI CVS: S1-S2 clear, no murmur rubs or gallops Chest: clear to auscultation bilaterally, no wheezing, rales or rhonchi Abdomen: soft nontender, nondistended, normal bowel sounds, no organomegaly Extremities: no cyanosis, clubbing or edema noted bilaterally Neuro: Cranial nerves II-XII intact, no focal neurological deficits  Discharge Instructions   Discharge Instructions    Diet general    Complete by:  As directed      Discharge instructions    Complete by:  As directed   Keep your self hydrated          Current Discharge Medication List    START taking these medications   Details  oseltamivir (TAMIFLU) 75 MG capsule Take 1 capsule (75 mg total) by mouth 2 (two) times daily. Qty: 10 capsule, Refills: 0      CONTINUE these medications which have NOT CHANGED   Details  naproxen (NAPROSYN) 375 MG tablet Take 375 mg by mouth 2 (two) times daily as needed  for mild pain.       Allergies  Allergen Reactions  . Codeine Nausea And Vomiting   Follow-up Information    Follow up with GATES,ROBERT NEVILL, MD In 2 weeks.   Specialty:  Internal Medicine   Contact information:   [address] [place] [address] [phone]        The results of significant diagnostics from this hospitalization  (including imaging, microbiology, ancillary and laboratory) are listed below for reference.    Significant Diagnostic Studies: Dg Chest Port 1 View  04/11/2014   CLINICAL DATA:  Cough and fever for 2 days  EXAM: PORTABLE CHEST - 1 VIEW  COMPARISON:  None.  FINDINGS: The heart size and mediastinal contours are within normal limits. Both lungs are clear. The visualized skeletal structures are unremarkable.  IMPRESSION: No active disease.   Electronically Signed   By: Inez Catalina M.D.   On: 04/11/2014 13:33    Microbiology: Recent Results (from the past 240 hour(s))  Rapid strep screen     Status: None   Collection Time: 04/11/14 12:04 PM  Result Value Ref Range Status   Streptococcus, Group A Screen (Direct) NEGATIVE NEGATIVE Final    Comment: (NOTE) A Rapid Antigen test may result negative if the antigen level in the sample is below the detection level of this test. The FDA has not cleared this test as a stand-alone test therefore the rapid antigen negative result has reflexed to a Group A Strep culture.   Blood Culture (routine x 2)     Status: None (Preliminary result)   Collection Time: 04/11/14  1:40 PM  Result Value Ref Range Status   Specimen Description BLOOD LEFT ANTECUBITAL  Final   Special Requests BOTTLES DRAWN AEROBIC AND ANAEROBIC 10CC  Final   Culture   Final           BLOOD CULTURE RECEIVED NO GROWTH TO DATE CULTURE WILL BE HELD FOR 5 DAYS BEFORE ISSUING A FINAL NEGATIVE REPORT Performed at [facility]    Report Status PENDING  Incomplete  Blood Culture (routine x 2)     Status: None (Preliminary result)   Collection Time: 04/11/14  1:50 PM  Result Value Ref Range Status   Specimen Description BLOOD HAND LEFT  Final   Special Requests BOTTLES DRAWN AEROBIC AND ANAEROBIC 5CC  Final   Culture   Final           BLOOD CULTURE RECEIVED NO GROWTH TO DATE CULTURE WILL BE HELD FOR 5 DAYS BEFORE ISSUING A FINAL NEGATIVE REPORT Performed at [facility]     Report Status PENDING  Incomplete     Labs: Basic Metabolic Panel:  Recent Labs Lab 04/11/14 1209 04/12/14 0813  NA 136 141  K 3.7 3.7  CL 103 110  CO2 24 22  GLUCOSE 104* 139*  BUN <5* 8  CREATININE 1.12* 0.81  CALCIUM 9.0 8.1*   Liver Function Tests:  Recent Labs Lab 04/11/14 1209  AST 23  ALT 15  ALKPHOS 49  BILITOT 1.3*  PROT 7.5  ALBUMIN 4.3    Recent Labs Lab 04/11/14 1344  LIPASE 31   No results for input(s): AMMONIA in the last 168 hours. CBC:  Recent Labs Lab 04/11/14 1209 04/12/14 0813  WBC 4.4 4.5  NEUTROABS 3.4  --   HGB 12.5 11.1*  HCT 35.8* 31.6*  MCV 85.9 86.8  PLT 181 151   Cardiac Enzymes: No results for input(s): CKTOTAL,  CKMB, CKMBINDEX, TROPONINI in the last 168 hours. BNP: BNP (last 3 results) No results for input(s): BNP in the last 8760 hours.  ProBNP (last 3 results) No results for input(s): PROBNP in the last 8760 hours.  CBG: No results for input(s): GLUCAP in the last 168 hours.     Signed:  Zaelyn Noack A  [facility] 04/12/2014, 2:18 PM

## 2014-04-13 LAB — CULTURE, GROUP A STREP

## 2014-04-13 LAB — HIV ANTIBODY (ROUTINE TESTING W REFLEX): HIV SCREEN 4TH GENERATION: NONREACTIVE

## 2014-04-17 LAB — CULTURE, BLOOD (ROUTINE X 2)
CULTURE: NO GROWTH
CULTURE: NO GROWTH

## 2014-06-19 ENCOUNTER — Encounter: Payer: Self-pay | Admitting: Neurology

## 2014-06-19 ENCOUNTER — Ambulatory Visit (INDEPENDENT_AMBULATORY_CARE_PROVIDER_SITE_OTHER): Payer: 59 | Admitting: Neurology

## 2014-06-19 VITALS — BP 107/79 | HR 84 | Temp 98.1°F | Ht 60.0 in | Wt 137.0 lb

## 2014-06-19 DIAGNOSIS — G4485 Primary stabbing headache: Secondary | ICD-10-CM | POA: Diagnosis not present

## 2014-06-19 DIAGNOSIS — H538 Other visual disturbances: Secondary | ICD-10-CM | POA: Diagnosis not present

## 2014-06-19 DIAGNOSIS — R531 Weakness: Secondary | ICD-10-CM | POA: Diagnosis not present

## 2014-06-19 DIAGNOSIS — H93A2 Pulsatile tinnitus, left ear: Secondary | ICD-10-CM | POA: Insufficient documentation

## 2014-06-19 DIAGNOSIS — G441 Vascular headache, not elsewhere classified: Secondary | ICD-10-CM | POA: Diagnosis not present

## 2014-06-19 DIAGNOSIS — R51 Headache: Secondary | ICD-10-CM

## 2014-06-19 DIAGNOSIS — H9312 Tinnitus, left ear: Secondary | ICD-10-CM

## 2014-06-19 DIAGNOSIS — R519 Headache, unspecified: Secondary | ICD-10-CM | POA: Insufficient documentation

## 2014-06-19 HISTORY — DX: Primary stabbing headache: G44.85

## 2014-06-19 NOTE — Progress Notes (Signed)
[facility]    Provider:  Dr Lucia GaskinsAhern Referring Provider: Flo ShanksWolicki, Karol, MD Primary Care Physician:  Pearla DubonnetGATES,ROBERT NEVILL, MD  CC:  headaches  HPI:  Jill Melendez is a 44 y.o. female here as a referral from Dr. Lazarus SalinesWolicki for headaches. 12 years. Gets worse during her menses.  When she is ovulating or period, she gets a stabbing headache. Always on the right side, parieto occipito areas. Brief. Lightning, excruciating, severe. Some months it doesn't happen at all. May happen 2-3 times a month. No vision changes, no other focal neurologic symptoms, no photo or phonophobia, no nausea, no vomiting. May not happen one day, may happen over several days. Sporadic. Menses lasts 5 days, ovulation is 4. She gets a "drum beat" in her left ear, like a pulse in her left ear. She has felt watery sensations in the right area where she gets the stabbing pain.  Too fast for anything to try and make the pain better and she only takes holistic. She also feel weaker, had an episode of difficulty walking. Has double vision/blurry vision occasionally and weakness.   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 She had EMG surgery on the left 2 in the distant past. She also described right vertex headaches synonymous with her menses or ambulation. Pure-tone testing showed normal hearing in each ear with 100% discrimination each side. Tympanograms normally side. MRI of the brain showed negative IAC imaging, normal  Personally reviewed images and agree that MRi of the brain is normal.   Review of Systems: Patient complains of symptoms per HPI as well as the following symptoms: Fatigue, easy bruising, feeling hot, feeling cold, ringing in ears, joint pain, cramps, aching muscles, allergies, numbness, weakness, restless legs, anxiety, decreased  energy.. Pertinent negatives per HPI. All others negative.   History   Social History  . Marital Status: Single    Spouse Name: Loraine LericheMark   . Number of Children: 0  . Years of Education: GED   Occupational History  . [employer]    Social History Main Topics  . Smoking status: Never Smoker   . Smokeless tobacco: Not on file  . Alcohol Use: 0.0 oz/week    0 Standard drinks or equivalent per week     Comment: 3 glasses a month (wine or beer)  . Drug Use: 1.00 per week     Comment: Marijuana   . Sexual Activity: Not on file   Other Topics Concern  . Not on file   Social History Narrative   Lives at home with finance and step daughter.   Caffeine use: none. Stopped drinking any caffeine in 2014.     Family History  Problem Relation Age of Onset  . High blood pressure Mother   . Stroke Mother     Past Medical History  Diagnosis Date  . Migraine   . Anxiety   . Endometriosis     Past Surgical History  Procedure Laterality Date  . Breast lumpectomy Left 1994    Current Outpatient Prescriptions  Medication Sig Dispense Refill  . cyclobenzaprine (FLEXERIL) 10 MG tablet Take 10 mg by mouth as needed for muscle spasms.    Marland Kitchen. LORazepam (ATIVAN) 1 MG tablet Take 1 mg by mouth daily as needed for anxiety.    . naproxen (NAPROSYN) 500 MG tablet Take 500 mg  by mouth every 12 (twelve) hours as needed.    . pantoprazole (PROTONIX) 20 MG tablet Take 20 mg by mouth daily.     No current facility-administered medications for this visit.    Allergies as of 06/19/2014 - Review Complete 06/19/2014  Allergen Reaction Noted  . Codeine Itching and Nausea And Vomiting 06/19/2014    Vitals: BP 107/79 mmHg  Pulse 84  Temp(Src) 98.1 F (36.7 C)  Ht 5' (1.524 m)  Wt 137 lb (62.143 kg)  BMI 26.76 kg/m2 Last Weight:  Wt Readings from Last 1 Encounters:  06/19/14 137 lb (62.143 kg)   Last Height:   Ht Readings from Last 1 Encounters:  06/19/14 5' (1.524 m)     Physical exam: Exam: Gen: NAD, conversant, well nourised, well groomed                     CV: RRR, no MRG. No Carotid Bruits. No peripheral edema, warm, nontender Eyes: Conjunctivae clear without exudates or hemorrhage  Neuro: Detailed Neurologic Exam  Speech:    Speech is normal; fluent and spontaneous with normal comprehension.  Cognition:    The patient is oriented to person, place, and time;     recent and remote memory intact;     language fluent;     normal attention, concentration,     fund of knowledge Cranial Nerves:    The pupils are equal, round, and reactive to light. The fundi are normal and spontaneous venous pulsations are present. Visual fields are full to finger confrontation. Extraocular movements are intact. Trigeminal sensation is intact and the muscles of mastication are normal. The face is symmetric. The palate elevates in the midline. Hearing intact. Voice is normal. Shoulder shrug is normal. The tongue has normal motion without fasciculations.   Coordination:    Normal finger to nose and heel to shin. Normal rapid alternating movements.   Gait:    Heel-toe and tandem gait are normal.   Motor Observation:    No asymmetry, no atrophy, and no involuntary movements noted. Tone:    Normal muscle tone.    Posture:    Posture is normal. normal erect    Strength:    Strength is V/V in the upper and lower limbs.      Sensation: intact to LT     Reflex Exam:  DTR's:    Deep tendon reflexes in the upper and lower extremities are normal bilaterally.   Toes:    The toes are downgoing bilaterally.   Clonus:    Clonus is absent.      Assessment/Plan:  44 year old female with a worsening headache and pulsatile tinnitus in the left ear. She is not interested in medication at this time, feels herbal teas help with the symptoms. ENT evaluation was negative. Byrd Hesselbach of the brain was normal. Pulsatile tinnitus we'll order a CT angiogram of the head to rule  out aneurysms. We'll order labs suggest CMP due to headaches CK and myasthenia gravis workup considering patient's symptoms of diplopia and weakness however all likely to be normal.  Naomie Dean, MD  [facility] [address] [place], [place] [address]  Phone [phone] Fax [phone]

## 2014-06-19 NOTE — Patient Instructions (Signed)
Overall you are doing fairly well but I do want to suggest a few things today:   Remember to drink plenty of fluid, eat healthy meals and do not skip any meals. Try to eat protein with a every meal and eat a healthy snack such as fruit or nuts in between meals. Try to keep a regular sleep-wake schedule and try to exercise daily, particularly in the form of walking, 20-30 minutes a day, if you can.   As far as your medications are concerned, I would like to suggest: none at this time.   As far as diagnostic testing: CT to look at blood vessels in the head. Labwork.   My clinical assistant and will answer any of your questions and relay your messages to me and also relay most of my messages to you.   Our phone number is [phone]. We also have an after hours call service for urgent matters and there is a physician on-call for urgent questions. For any emergencies you know to call 911 or go to the nearest emergency room

## 2014-06-22 ENCOUNTER — Ambulatory Visit
Admission: RE | Admit: 2014-06-22 | Discharge: 2014-06-22 | Disposition: A | Discharge status: Home / Self Care | Payer: 59 | Source: Ambulatory Visit | Attending: Neurology | Admitting: Neurology

## 2014-06-22 ENCOUNTER — Telehealth: Payer: Self-pay | Admitting: Neurology

## 2014-06-22 DIAGNOSIS — H93A2 Pulsatile tinnitus, left ear: Secondary | ICD-10-CM

## 2014-06-22 DIAGNOSIS — H538 Other visual disturbances: Secondary | ICD-10-CM

## 2014-06-22 DIAGNOSIS — G441 Vascular headache, not elsewhere classified: Secondary | ICD-10-CM

## 2014-06-22 MED ORDER — IOPAMIDOL (ISOVUE-370) INJECTION 76%
75.0000 mL | Freq: Once | INTRAVENOUS | Status: AC | PRN
  Administered 2014-06-22: 75 mL via INTRAVENOUS

## 2014-06-22 NOTE — Telephone Encounter (Signed)
Spoke to patient. Discussed unremarkable CTA, and normal labs.

## 2014-06-30 LAB — COMPREHENSIVE METABOLIC PANEL
ALK PHOS: 43 IU/L (ref 39–117)
ALT: 20 IU/L (ref 0–32)
AST: 20 IU/L (ref 0–40)
Albumin/Globulin Ratio: 1.7 (ref 1.1–2.5)
Albumin: 4.6 g/dL (ref 3.5–5.5)
BUN / CREAT RATIO: 23 (ref 9–23)
BUN: 16 mg/dL (ref 6–24)
Bilirubin Total: 0.7 mg/dL (ref 0.0–1.2)
CO2: 25 mmol/L (ref 18–29)
CREATININE: 0.71 mg/dL (ref 0.57–1.00)
Calcium: 9.8 mg/dL (ref 8.7–10.2)
Chloride: 100 mmol/L (ref 97–108)
GFR calc Af Amer: 121 mL/min/{1.73_m2} (ref >59)
GFR, EST NON AFRICAN AMERICAN: 105 mL/min/{1.73_m2} (ref >59)
GLOBULIN, TOTAL: 2.7 g/dL (ref 1.5–4.5)
Glucose: 87 mg/dL (ref 65–99)
POTASSIUM: 4.6 mmol/L (ref 3.5–5.2)
Sodium: 141 mmol/L (ref 134–144)
Total Protein: 7.3 g/dL (ref 6.0–8.5)

## 2014-06-30 LAB — CK: Total CK: 67 U/L (ref 24–173)

## 2014-06-30 LAB — ACETYLCHOLINE RECEPTOR, MODULATING: Acetylcholine Modulat Ab: <12 % (ref 0–20)

## 2014-06-30 LAB — ACETYLCHOLINE RECEPTOR, BINDING: ACHR BINDING AB, SERUM: 0.05 nmol/L (ref 0.00–0.24)

## 2014-06-30 LAB — ACETYLCHOLINE RECEPTOR, BLOCKING: Acetylchol Block Ab: 16 % (ref 0–25)

## 2014-08-03 ENCOUNTER — Telehealth: Payer: Self-pay | Admitting: Neurology

## 2014-08-03 NOTE — Telephone Encounter (Signed)
Left detailed VM for pt to let her know Dr. Lucia Gaskins spoke with her about normal labs and unremarkable CT results on 4/28. I told her I can send her a copy of each if she would like, but to call me back. Gave [facility] phone number and told her we are open until 5pm.

## 2014-08-03 NOTE — Telephone Encounter (Signed)
Patient called returning Jill Melendez's call. Please call and advise. Patient can be reached at [phone].

## 2014-08-03 NOTE — Telephone Encounter (Signed)
Spoke with pt about normal lab results and offered to mail them to her. She declined and stated she just wanted to see if we checked her thyroid levels. Pt worried to have labs done again because she is not sure if insurance will pay for it. She did not remember talking with Dr. Lucia Gaskins about lab work on 4/28. She stated her hair has been falling out and has increased significantly over the past couple weeks. She stated her drain was full of hair and her hair dresser also noticed hair loss and pt stated "She had to wipe her hands off every time she touched my hair". Pt stated she also has a lump in her throat. I asked if this caused breathing problems or trouble swallowing and pt denied. I told her if this occurs, to go to the emergency room. She tried calling PCP to make appt, but her doctor is out of office for the next 2 weeks. I asked if she can make an appt with someone else in the office and she stated she called and left a message and is waiting for a call back. I told her I would let Dr. Lucia Gaskins know and we will call her if we would like to do anything. Pt verbalized understanding.

## 2014-08-03 NOTE — Telephone Encounter (Signed)
Patient called and requested the results to her lab work she had done in April, she also wanted to know what was checked in the lab work. Please call and advise.

## 2014-08-04 NOTE — Telephone Encounter (Signed)
I cannot see patient for these symptoms. She would be best served to see her primary care. Please let patient know she has to see someone else in her primary care's office if he/she is out. We di dnot check her thyroid and again, that should be done via her pcp. I also did not prescribe any medication for patient. Please let patient know. Thanks.

## 2014-08-07 NOTE — Telephone Encounter (Signed)
Spoke with pt and see was able to f/u with NP at PCP office. They did some lab work she stated and her thyroid was normal. She is not sure why her hair continues to fall out. She has a f/u with her PCP in 3 weeks. I told her to call back if she needed anything else.

## 2014-08-07 NOTE — Telephone Encounter (Signed)
Thank you :)

## 2014-09-07 ENCOUNTER — Encounter (HOSPITAL_COMMUNITY): Payer: Self-pay | Admitting: Emergency Medicine

## 2015-03-15 ENCOUNTER — Other Ambulatory Visit: Payer: Self-pay

## 2015-03-15 DIAGNOSIS — Z1231 Encounter for screening mammogram for malignant neoplasm of breast: Secondary | ICD-10-CM

## 2015-03-27 ENCOUNTER — Ambulatory Visit
Admission: RE | Admit: 2015-03-27 | Discharge: 2015-03-27 | Disposition: A | Discharge status: Home / Self Care | Payer: BLUE CROSS/BLUE SHIELD | Source: Ambulatory Visit

## 2015-03-27 DIAGNOSIS — Z1231 Encounter for screening mammogram for malignant neoplasm of breast: Secondary | ICD-10-CM

## 2015-06-26 ENCOUNTER — Encounter: Payer: Self-pay | Admitting: Internal Medicine

## 2015-06-29 ENCOUNTER — Encounter: Payer: Self-pay | Admitting: Internal Medicine

## 2015-06-29 ENCOUNTER — Ambulatory Visit (INDEPENDENT_AMBULATORY_CARE_PROVIDER_SITE_OTHER): Payer: BLUE CROSS/BLUE SHIELD | Admitting: Internal Medicine

## 2015-06-29 ENCOUNTER — Other Ambulatory Visit (INDEPENDENT_AMBULATORY_CARE_PROVIDER_SITE_OTHER): Payer: BLUE CROSS/BLUE SHIELD

## 2015-06-29 VITALS — BP 100/70 | HR 77 | Ht 60.0 in | Wt 130.0 lb

## 2015-06-29 DIAGNOSIS — K589 Irritable bowel syndrome without diarrhea: Secondary | ICD-10-CM

## 2015-06-29 DIAGNOSIS — R1013 Epigastric pain: Secondary | ICD-10-CM

## 2015-06-29 DIAGNOSIS — R110 Nausea: Secondary | ICD-10-CM

## 2015-06-29 LAB — CBC WITH DIFFERENTIAL/PLATELET
BASOS ABS: 0 10*3/uL (ref 0.0–0.1)
Basophils Relative: 0.6 % (ref 0.0–3.0)
EOS ABS: 0.1 10*3/uL (ref 0.0–0.7)
Eosinophils Relative: 1.8 % (ref 0.0–5.0)
HEMATOCRIT: 39.8 % (ref 36.0–46.0)
HEMOGLOBIN: 13.7 g/dL (ref 12.0–15.0)
LYMPHS PCT: 28.6 % (ref 12.0–46.0)
Lymphs Abs: 1.7 10*3/uL (ref 0.7–4.0)
MCHC: 34.3 g/dL (ref 30.0–36.0)
MCV: 88.8 fl (ref 78.0–100.0)
MONO ABS: 0.4 10*3/uL (ref 0.1–1.0)
Monocytes Relative: 6.4 % (ref 3.0–12.0)
Neutro Abs: 3.8 10*3/uL (ref 1.4–7.7)
Neutrophils Relative %: 62.6 % (ref 43.0–77.0)
Platelets: 203 10*3/uL (ref 150.0–400.0)
RBC: 4.49 Mil/uL (ref 3.87–5.11)
RDW: 11.9 % (ref 11.5–15.5)
WBC: 6 10*3/uL (ref 4.0–10.5)

## 2015-06-29 LAB — COMPREHENSIVE METABOLIC PANEL
ALT: 11 U/L (ref 0–35)
AST: 13 U/L (ref 0–37)
Albumin: 4.5 g/dL (ref 3.5–5.2)
Alkaline Phosphatase: 41 U/L (ref 39–117)
BILIRUBIN TOTAL: 1.2 mg/dL (ref 0.2–1.2)
BUN: 15 mg/dL (ref 6–23)
CALCIUM: 9.5 mg/dL (ref 8.4–10.5)
CHLORIDE: 104 meq/L (ref 96–112)
CO2: 27 meq/L (ref 19–32)
CREATININE: 0.78 mg/dL (ref 0.40–1.20)
GFR: 85.47 mL/min (ref >60.00)
GLUCOSE: 100 mg/dL — AB (ref 70–99)
Potassium: 4.1 mEq/L (ref 3.5–5.1)
Sodium: 139 mEq/L (ref 135–145)
Total Protein: 7.7 g/dL (ref 6.0–8.3)

## 2015-06-29 LAB — IGA: IGA: 268 mg/dL (ref 68–378)

## 2015-06-29 LAB — LIPASE: LIPASE: 15 U/L (ref 11.0–59.0)

## 2015-06-29 LAB — AMYLASE: AMYLASE: 49 U/L (ref 27–131)

## 2015-06-29 MED ORDER — ONDANSETRON 4 MG PO TBDP: 4.0000 mg | ORAL_TABLET | Freq: Three times a day (TID) | ORAL | Status: DC | PRN

## 2015-06-29 MED ORDER — DICYCLOMINE HCL 20 MG PO TABS: 20.0000 mg | ORAL_TABLET | Freq: Four times a day (QID) | ORAL | Status: DC | PRN

## 2015-06-29 NOTE — Patient Instructions (Addendum)
You have been scheduled for an endoscopy. Please follow written instructions given to you at your visit today. If you use inhalers (even only as needed), please bring them with you on the day of your procedure. If you feel all better Mon or Tues call us as we may be able to cancel your procedure.  Your physician has requested that you go to the basement for lab work before leaving today.    We have sent the following medications to your pharmacy for you to pick up at your convenience: Generic zofran, generic Bentyl   Make sure and take  of Protonix daily.   I appreciate the opportunity to care for you. Food Choices to Help Relieve Diarrhea, Adult When you have diarrhea, the foods you eat and your eating habits are very important. Choosing the right foods and drinks can help relieve diarrhea. Also, because diarrhea can last up to 7 days, you need to replace lost fluids and electrolytes (such as sodium, potassium, and chloride) in order to help prevent dehydration.  WHAT GENERAL GUIDELINES DO I NEED TO FOLLOW?  Slowly drink 1 cup (8 oz) of fluid for each episode of diarrhea. If you are getting enough fluid, your urine will be clear or pale yellow.  Eat starchy foods. Some good choices include white rice, white toast, pasta, low-fiber cereal, baked potatoes (without the skin), saltine crackers, and bagels.  Avoid large servings of any cooked vegetables.  Limit fruit to two servings per day. A serving is  cup or 1 small piece.  Choose foods with less than 2 g of fiber per serving.  Limit fats to less than 8 tsp (38 g) per day.  Avoid fried foods.  Eat foods that have probiotics in them. Probiotics can be found in certain dairy products.  Avoid foods and beverages that may increase the speed at which food moves through the stomach and intestines (gastrointestinal tract). Things to avoid include:  High-fiber foods, such as dried fruit, raw fruits and vegetables, nuts, seeds, and  whole grain foods.  Spicy foods and high-fat foods.  Foods and beverages sweetened with high-fructose corn syrup, honey, or sugar alcohols such as xylitol, sorbitol, and mannitol. WHAT FOODS ARE RECOMMENDED? Grains White rice. White, [place], or pita breads (fresh or toasted), including plain rolls, buns, or bagels. White pasta. Saltine, soda, or graham crackers. Pretzels. Low-fiber cereal. Cooked cereals made with water (such as cornmeal, farina, or cream cereals). Plain muffins. Matzo. Melba toast. Zwieback.  Vegetables Potatoes (without the skin). Strained tomato and vegetable juices. Most well-cooked and canned vegetables without seeds. Tender lettuce. Fruits Cooked or canned applesauce, apricots, cherries, fruit cocktail, grapefruit, peaches, pears, or plums. Fresh bananas, apples without skin, cherries, grapes, cantaloupe, grapefruit, peaches, oranges, or plums.  Meat and Other Protein Products Baked or boiled chicken. Eggs. Tofu. Fish. Seafood. Smooth peanut butter. Ground or well-cooked tender beef, ham, veal, lamb, pork, or poultry.  Dairy Plain yogurt, kefir, and unsweetened liquid yogurt. Lactose-free milk, buttermilk, or soy milk. Plain hard cheese. Beverages Sport drinks. Clear broths. Diluted fruit juices (except prune). Regular, caffeine-free sodas such as ginger ale. Water. Decaffeinated teas. Oral rehydration solutions. Sugar-free beverages not sweetened with sugar alcohols. Other Bouillon, broth, or soups made from recommended foods.  The items listed above may not be a complete list of recommended foods or beverages. Contact your dietitian for more options. WHAT FOODS ARE NOT RECOMMENDED? Grains Whole grain, whole wheat, bran, or rye breads, rolls, pastas, crackers, and cereals. Wild or  brown rice. Cereals that contain more than 2 g of fiber per serving. Corn tortillas or taco shells. Cooked or dry oatmeal. Granola. Popcorn. Vegetables Raw vegetables. Cabbage, broccoli,  Brussels sprouts, artichokes, baked beans, beet greens, corn, kale, legumes, peas, sweet potatoes, and yams. Potato skins. Cooked spinach and cabbage. Fruits Dried fruit, including raisins and dates. Raw fruits. Stewed or dried prunes. Fresh apples with skin, apricots, mangoes, pears, raspberries, and strawberries.  Meat and Other Protein Products Chunky peanut butter. Nuts and seeds. Beans and lentils. Tomasa BlaseBacon.  Dairy High-fat cheeses. Milk, chocolate milk, and beverages made with milk, such as milk shakes. Cream. Ice cream. Sweets and Desserts Sweet rolls, doughnuts, and sweet breads. Pancakes and waffles. Fats and Oils Butter. Cream sauces. Margarine. Salad oils. Plain salad dressings. Olives. Avocados.  Beverages Caffeinated beverages (such as coffee, tea, soda, or energy drinks). Alcoholic beverages. Fruit juices with pulp. Prune juice. Soft drinks sweetened with high-fructose corn syrup or sugar alcohols. Other Coconut. Hot sauce. Chili powder. Mayonnaise. Gravy. Cream-based or milk-based soups.  The items listed above may not be a complete list of foods and beverages to avoid. Contact your dietitian for more information. WHAT SHOULD I DO IF I BECOME DEHYDRATED? Diarrhea can sometimes lead to dehydration. Signs of dehydration include dark urine and dry mouth and skin. If you think you are dehydrated, you should rehydrate with an oral rehydration solution. These solutions can be purchased at pharmacies, retail stores, or online.  Drink -1 cup (120-240 mL) of oral rehydration solution each time you have an episode of diarrhea. If drinking this amount makes your diarrhea worse, try drinking smaller amounts more often. For example, drink 1-3 tsp (5-15 mL) every 5-10 minutes.  A general rule for staying hydrated is to drink 1-2 L of fluid per day. Talk to your health care provider about the specific amount you should be drinking each day. Drink enough fluids to keep your urine clear or pale  yellow.   This information is not intended to replace advice given to you by your health care provider. Make sure you discuss any questions you have with your health care provider.   Document Released: 05/03/2003 Document Revised: 03/03/2014 Document Reviewed: 01/03/2013 Elsevier Interactive Patient Education [employer]2016 Elsevier Inc.

## 2015-06-29 NOTE — Progress Notes (Signed)
Subjective:    Patient ID: Jill Melendez, female    DOB: [birth year], 45 y.o.   MRN: [id number] Cc: nausea, epigastric pain HPI Very nice middle-aged woman w/ 2 wek hx intense nausea, belching, burning epigastric pain and 2 d diarrhea. Diarrhea self-limited this time but has hx of IBS-D - after flu and sepsis last year has had 1 stool/d no problems. No sick contacts, new meds Abx before this. Had been using prn PPI for UGI showing trace reflux and some similar but less intense sxs in past. Struggling w/ these sxs now. Denies sig stressors.Hx feeling "liver quiver"and says acupuncturist told her there is some issue with liver based upon acupuncture. LFT's always NL, Dr. Kevan Ny suggested she was feeling her colon.  Saw PA at PCP - told she had a viarl infection, rx promethazine - this gave her nightmares She had a EGD and colonoscopy in her 29's- negative. Has always hd sweats and felt hot.Hx swollen irritated hemorrhoids with menses and bloating also but since going on a natural hormone supplement in past 6 months these stopped. Hx endometriosis.   Does use marijuana about 2x/week  Had been using naprosyn off and on though not much if at all since Jan  Denies sig stressors  Allergies  Allergen Reactions  . Codeine Nausea And Vomiting  . Codeine Itching and Nausea And Vomiting   Outpatient Prescriptions Prior to Visit  Medication Sig Dispense Refill  . cyclobenzaprine (FLEXERIL) 10 MG tablet Take 10 mg by mouth as needed for muscle spasms.    Marland Kitchen LORazepam (ATIVAN) 1 MG tablet Take 1 mg by mouth daily as needed for anxiety.    . naproxen (NAPROSYN) 500 MG tablet Take 500 mg by mouth every 12 (twelve) hours as needed.    . pantoprazole (PROTONIX) 20 MG tablet Take 20 mg by mouth 2 (two) times daily.     . naproxen (NAPROSYN) 375 MG tablet Take 375 mg by mouth 2 (two) times daily as needed for mild pain.    Marland Kitchen oseltamivir (TAMIFLU) 75 MG capsule Take 1 capsule (75 mg total) by mouth 2 (two)  times daily. 10 capsule 0   No facility-administered medications prior to visit.   Past Medical History  Diagnosis Date  . Migraine   . Anxiety   . Endometriosis   . Sepsis (HCC) 04/11/2014  . 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 Influenza due to influenza virus, type B 04/12/2014   Past Surgical History  Procedure Laterality Date  . Breast surgery    . Breast lumpectomy Left 1994   Social History   Social History  . Marital Status: Single    Spouse Name: Loraine Leriche   . Number of Children: 0  . Years of Education: GED   Occupational History  . [employer]    Social History Main Topics  . Smoking status: Never Smoker   . Smokeless tobacco: Never Used  . Alcohol Use: 0.6 oz/week    1 Cans of beer, 0 Standard drinks or equivalent per week     Comment: occ  . Drug Use: 1.00 per week     Comment: Marijuana   . Sexual Activity: Yes    Birth Control/ Protection: None   Other Topics Concern  . None   Social History Narrative   Lives at home with finance and step daughter.   Self-employed esthetician nail tech   Caffeine use: none. Stopped drinking any caffeine in 2014.    Family History  Problem  Relation Age of Onset  . High blood pressure Mother   . Stroke Mother   . Colon cancer Paternal Grandfather 270   Review of Systems Stabbing headaches, menstrual pain, fatigue, anxiety and as per HPI all other negative    Objective:   Physical Exam @BP  100/70 mmHg  Pulse 77  Ht 5' (1.524 m)  Wt 130 lb (58.968 kg)  BMI 25.39 kg/m2  LMP 06/06/2015@  General:  Well-developed, well-nourished and in no acute distress Eyes:  anicteric. ENT:   Mouth and posterior pharynx free of lesions.  Neck:   supple w/o thyromegaly or mass.  Lungs: Clear to auscultation bilaterally. Heart:  S1S2, no rubs, murmurs, gallops. Abdomen:  soft, tender upper abdomen, no hepatosplenomegaly, hernia, or mass and BS+.  Lymph:  no cervical or supraclavicular adenopathy. Extremities:    no edema, cyanosis or clubbing Skin   no rash. Neuro:  A&O x 3.  Psych:  appropriate mood and  Affect.   Data Reviewed:  As per HPI      Assessment & Plan:   Encounter Diagnoses  Name Primary?  . Abdominal pain, epigastric Yes  . Nausea without vomiting   . IBS (irritable bowel syndrome)    Differential dx includes functional GI disorder, gastritis/gastroenteritis, ulcer disease. Could have a combination.   PPI qd Ondansetron prn Dicyclomine prn TTG IgA cbc cmet lipase amylase BRAT diet advance as tol Schedule EGD The risks and benefits as well as alternatives of endoscopic procedure(s) have been discussed and reviewed. All questions answered. The patient agrees to proceed. If sxs resolve before then probably cancel EGD  I appreciate the opportunity to care for this patient.  MW:NUUVO,ZDGUYQCc:GATES,ROBERT NEVILL, MD  Lab Results  Component Value Date   WBC 6.0 06/29/2015   HGB 13.7 06/29/2015   HCT 39.8 06/29/2015   MCV 88.8 06/29/2015   PLT 203.0 06/29/2015     Chemistry      Component Value Date/Time   NA 139 06/29/2015 1151   NA 141 06/19/2014 1026   K 4.1 06/29/2015 1151   CL 104 06/29/2015 1151   CO2 27 06/29/2015 1151   BUN 15 06/29/2015 1151   BUN 16 06/19/2014 1026   CREATININE 0.78 06/29/2015 1151      Component Value Date/Time   CALCIUM 9.5 06/29/2015 1151   ALKPHOS 41 06/29/2015 1151   AST 13 06/29/2015 1151   ALT 11 06/29/2015 1151   BILITOT 1.2 06/29/2015 1151   BILITOT 0.7 06/19/2014 1026      Lab Results  Component Value Date   AMYLASE 49 06/29/2015   Lab Results  Component Value Date   LIPASE 15.0 06/29/2015

## 2015-07-02 ENCOUNTER — Telehealth: Payer: Self-pay | Admitting: Internal Medicine

## 2015-07-02 NOTE — Telephone Encounter (Signed)
Patient will come tomorrow at 7:30 for 8:30.  She is advised to be NPO after midnight

## 2015-07-02 NOTE — Telephone Encounter (Signed)
Patient not feeling any better.  She is asking if there is an earlier spot.  She is offered tomorrow at 8:30.  She will talk with her husband and call back

## 2015-07-03 ENCOUNTER — Ambulatory Visit (AMBULATORY_SURGERY_CENTER): Payer: BLUE CROSS/BLUE SHIELD | Admitting: Internal Medicine

## 2015-07-03 ENCOUNTER — Encounter: Payer: Self-pay | Admitting: Internal Medicine

## 2015-07-03 VITALS — BP 128/65 | HR 61 | Temp 98.0°F | Resp 18 | Ht 60.0 in | Wt 130.0 lb

## 2015-07-03 DIAGNOSIS — K2970 Gastritis, unspecified, without bleeding: Secondary | ICD-10-CM | POA: Diagnosis not present

## 2015-07-03 DIAGNOSIS — B9681 Helicobacter pylori [H. pylori] as the cause of diseases classified elsewhere: Secondary | ICD-10-CM | POA: Diagnosis not present

## 2015-07-03 DIAGNOSIS — K2990 Gastroduodenitis, unspecified, without bleeding: Secondary | ICD-10-CM

## 2015-07-03 DIAGNOSIS — R1013 Epigastric pain: Secondary | ICD-10-CM

## 2015-07-03 LAB — TISSUE TRANSGLUTAMINASE, IGA: Tissue Transglutaminase Ab, IgA: 1 U/mL (ref <4)

## 2015-07-03 MED ORDER — SODIUM CHLORIDE 0.9 % IV SOLN: 500.0000 mL | INTRAVENOUS | Status: DC

## 2015-07-03 NOTE — Progress Notes (Signed)
Called to room to assist during endoscopic procedure.  Patient ID and intended procedure confirmed with present staff. Received instructions for my participation in the procedure from the performing physician.  

## 2015-07-03 NOTE — Progress Notes (Signed)
Report to PACU, RN, vss, BBS= Clear.  

## 2015-07-03 NOTE — Progress Notes (Signed)
No egg or soy allergy known to patient  No issues with past sedation with any surgeries  or procedures, no intubation problems  No diet pills per patient No home 02 use per patient  No blood thinners per patient  Pt denies issues with constipation   

## 2015-07-03 NOTE — Op Note (Signed)
[facility] Patient Name: Jill Melendez Procedure Date: 07/03/2015 7:48 AM MRN: [id number] Endoscopist: Iva Boop , MD Age: 45 Date of Birth: Feb 11, 1971 Gender: Female Procedure:                Upper GI endoscopy Indications:              Epigastric abdominal pain Medicines:                Propofol per Anesthesia, Monitored Anesthesia Care Procedure:                Pre-Anesthesia Assessment:                           - Prior to the procedure, a History and Physical                            was performed, and patient medications and                            allergies were reviewed. The patient's tolerance of                            previous anesthesia was also reviewed. The risks                            and benefits of the procedure and the sedation                            options and risks were discussed with the patient.                            All questions were answered, and informed consent                            was obtained. Prior Anticoagulants: The patient has                            taken no previous anticoagulant or antiplatelet                            agents. ASA Grade Assessment: II - A patient with                            mild systemic disease. After reviewing the risks                            and benefits, the patient was deemed in                            satisfactory condition to undergo the procedure.                           After obtaining informed consent, the endoscope was  passed under direct vision. Throughout the                            procedure, the patient's blood pressure, pulse, and                            oxygen saturations were monitored continuously. The                            Model GIF-[id number]) scope was introduced                            through the mouth, and advanced to the second part                            of duodenum. The upper GI endoscopy was                             accomplished without difficulty. The patient                            tolerated the procedure well. Scope In: Scope Out: Findings:                 Diffuse mild inflammation characterized by erythema                            was found in the gastric antrum. Biopsies were                            taken with a cold forceps for histology.                            Verification of patient identification for the                            specimen was done. Estimated blood loss was minimal.                           Localized prominent gastric folds were found in the                            cardia. Biopsies were taken with a cold forceps for                            histology. Verification of patient identification                            for the specimen was done. Estimated blood loss was                            minimal.                           The exam was otherwise  without abnormality.                           gastric retroflexion performed Complications:            No immediate complications. Estimated Blood Loss:     Estimated blood loss was minimal. Impression:               - Gastritis. Biopsied.                           - Enlarged gastric folds. Biopsied.                           - The examination was otherwise normal. Recommendation:           - Patient has a contact number available for                            emergencies. The signs and symptoms of potential                            delayed complications were discussed with the                            patient. Return to normal activities tomorrow.                            Written discharge instructions were provided to the                            patient.                           - Resume previous diet.                           - Continue present medications.                           - Await pathology results. Iva Boop, MD 07/03/2015 12:39:58 PM This report has  been signed electronically.

## 2015-07-03 NOTE — Patient Instructions (Addendum)
Mild gastritis - stomach inflammation - biopsies taken. Looks like some inflammation just below esophagus. Also biopsied.  I will let you know - keep doing what you are doing. Only pending lab is the celiac test.   I appreciate the opportunity to care for you. Iva Booparl E. Gessner, MD, [facility] Center[facility]AC[facility]  Gastritis (handout given)  YOU HAD AN ENDOSCOPIC PROCEDURE TODAY AT THE [facility]:   Refer to the procedure report that was given to you for any specific questions about what was found during the examination.  If the procedure report does not answer your questions, please call your gastroenterologist to clarify.  If you requested that your care partner not be given the details of your procedure findings, then the procedure report has been included in a sealed envelope for you to review at your convenience later.  YOU SHOULD EXPECT: Some feelings of bloating in the abdomen. Passage of more gas than usual.  Walking can help get rid of the air that was put into your GI tract during the procedure and reduce the bloating. If you had a lower endoscopy (such as a colonoscopy or flexible sigmoidoscopy) you may notice spotting of blood in your stool or on the toilet paper. If you underwent a bowel prep for your procedure, you may not have a normal bowel movement for a few days.  Please Note:  You might notice some irritation and congestion in your nose or some drainage.  This is from the oxygen used during your procedure.  There is no need for concern and it should clear up in a day or so.  SYMPTOMS TO REPORT IMMEDIATELY:   Following upper endoscopy (EGD)  Vomiting of blood or coffee ground material  New chest pain or pain under the shoulder blades  Painful or persistently difficult swallowing  New shortness of breath  Fever of 100F or higher  Black, tarry-looking stools  For urgent or emergent issues, a gastroenterologist can be reached at any hour by calling [phone].   DIET: Your  first meal following the procedure should be a small meal and then it is ok to progress to your normal diet. Heavy or fried foods are harder to digest and may make you feel nauseous or bloated.  Likewise, meals heavy in dairy and vegetables can increase bloating.  Drink plenty of fluids but you should avoid alcoholic beverages for 24 hours.  ACTIVITY:  You should plan to take it easy for the rest of today and you should NOT DRIVE or use heavy machinery until tomorrow (because of the sedation medicines used during the test).    FOLLOW UP: Our staff will call the number listed on your records the next business day following your procedure to check on you and address any questions or concerns that you may have regarding the information given to you following your procedure. If we do not reach you, we will leave a message.  However, if you are feeling well and you are not experiencing any problems, there is no need to return our call.  We will assume that you have returned to your regular daily activities without incident.  If any biopsies were taken you will be contacted by phone or by letter within the next 1-3 weeks.  Please call us at [phone] if you have not heard about the biopsies in 3 weeks.    SIGNATURES/CONFIDENTIALITY: You and/or your care partner have signed paperwork which will be entered into your electronic medical record.  These  signatures attest to the fact that that the information above on your After Visit Summary has been reviewed and is understood.  Full responsibility of the confidentiality of this discharge information lies with you and/or your care-partner. 

## 2015-07-04 ENCOUNTER — Telehealth: Payer: Self-pay

## 2015-07-04 NOTE — Telephone Encounter (Signed)
  Follow up Call-  Call back number 07/03/2015  Post procedure Call Back phone  # [phone]  Permission to leave phone message Yes     Patient questions:  Do you have a fever, pain , or abdominal swelling? No. Pain Score  0 *  Have you tolerated food without any problems? Yes.    Have you been able to return to your normal activities? Yes.    Do you have any questions about your discharge instructions: Diet   No. Medications  No. Follow up visit  No.  Do you have questions or concerns about your Care? No.  Actions: * If pain score is 4 or above: No action needed, pain <4.

## 2015-07-05 ENCOUNTER — Encounter: Payer: BLUE CROSS/BLUE SHIELD | Admitting: Internal Medicine

## 2015-07-05 NOTE — Progress Notes (Signed)
Quick Note:  Test negative for celiac My Chart notice ______

## 2015-07-06 ENCOUNTER — Telehealth: Payer: Self-pay | Admitting: Internal Medicine

## 2015-07-06 DIAGNOSIS — A048 Other specified bacterial intestinal infections: Secondary | ICD-10-CM

## 2015-07-06 MED ORDER — BISMUTH SUBSALICYLATE 262 MG PO CHEW: 524.0000 mg | CHEWABLE_TABLET | Freq: Four times a day (QID) | ORAL | Status: DC

## 2015-07-06 MED ORDER — METRONIDAZOLE 250 MG PO TABS: 250.0000 mg | ORAL_TABLET | Freq: Four times a day (QID) | ORAL | Status: DC

## 2015-07-06 MED ORDER — DOXYCYCLINE HYCLATE 100 MG PO TABS: 100.0000 mg | ORAL_TABLET | Freq: Two times a day (BID) | ORAL | Status: DC

## 2015-07-06 NOTE — Telephone Encounter (Signed)
See result note on path Thx

## 2015-07-06 NOTE — Telephone Encounter (Signed)
See path results for further documentation.

## 2015-07-06 NOTE — Progress Notes (Signed)
Quick Note:  H. Pylori gastritis  1) Pantoprazole 20 mg 2 times a day x 14 d - med list says she is on this 2) Pepto Bismol 2 tabs (262 mg each) 4 times a day x 14 d 3) Metronidazole 250 mg 4 times a day x 14 d 4) doxycycline 100 mg 2 times a day x 14 d  After 14 d stop omeprazole also  In 4 weeks after treatment completed do H. Pylori stool antigen - dx H. Pylori gastritis and will call results  Will arrange f/u after that if necessary or she can call back sooner prn  No letter/recall [facility]  ______

## 2015-07-06 NOTE — Telephone Encounter (Signed)
Patient calling back with continued symptoms.  Path shows positive H pylori.  Please advise

## 2015-07-10 ENCOUNTER — Telehealth: Payer: Self-pay | Admitting: Internal Medicine

## 2015-07-10 NOTE — Telephone Encounter (Signed)
agree

## 2015-07-10 NOTE — Telephone Encounter (Signed)
Patient report terrible headache.  But she realized after she left her first message that she had a HA prior to starting on h pylori tx.  She is encouraged to contact her primary care.  She states that she had a friend that had meningitis lately and wanted to know if this is causing the HA.Marland Kitchen. She also has a history of migraines.  She is encouraged to contact her primary care to look for causes of HA.  She is encouraged to continue antibiotic treatment until she completes all 14 days.

## 2015-07-16 ENCOUNTER — Telehealth: Payer: Self-pay | Admitting: Internal Medicine

## 2015-07-16 MED ORDER — METRONIDAZOLE 250 MG PO TABS: 250.0000 mg | ORAL_TABLET | Freq: Four times a day (QID) | ORAL | Status: DC

## 2015-07-16 NOTE — Telephone Encounter (Signed)
Patient didn't have enough of her flagyl to do the 14 day course, more sent in and called her to confirm pharmacy.  She is feeling better.  Reviewed with her about getting the follow up stool testing done, she is aware of the lab hours.

## 2015-07-20 ENCOUNTER — Telehealth: Payer: Self-pay | Admitting: Internal Medicine

## 2015-07-20 NOTE — Telephone Encounter (Signed)
Patient notified ok to resume dairy tomorrow. She is advised that we will test her stool in a month to confirm eradication.  She takes the last of her meds today at lunch.  She is advised that her lack of improvement maybe side effects from all the medications.  She should let us know after a few days of completing all of the antibiotics if she is not feeling any better.

## 2015-08-15 ENCOUNTER — Telehealth: Payer: Self-pay | Admitting: Internal Medicine

## 2015-08-15 NOTE — Telephone Encounter (Signed)
Pt states she is going to bring in her stool test for h pylori to see if bacteria has cleared on Friday. Pt states she has been having episodes off and on when her heart rate is up. She has terrible low abdominal cramping. Pt states then she has times where her hands and feet cramp and draw up. Pt states a couple of times she has just taken the dog out and had these symptoms. Pt requests to see Dr. Leone PayorGessner. Pt scheduled to see Dr. Leone PayorGessner 08/30/15@11 :45am.

## 2015-08-17 ENCOUNTER — Other Ambulatory Visit: Payer: Self-pay | Admitting: Internal Medicine

## 2015-08-17 ENCOUNTER — Other Ambulatory Visit: Payer: Self-pay

## 2015-08-17 DIAGNOSIS — A048 Other specified bacterial intestinal infections: Secondary | ICD-10-CM

## 2015-08-20 LAB — HELICOBACTER PYLORI  SPECIAL ANTIGEN: H. PYLORI Antigen: NOT DETECTED

## 2015-08-29 NOTE — Progress Notes (Signed)
Quick Note:  H. Pylori negative Will discuss more at 7/6 appt ______

## 2015-08-30 ENCOUNTER — Ambulatory Visit (INDEPENDENT_AMBULATORY_CARE_PROVIDER_SITE_OTHER): Payer: BLUE CROSS/BLUE SHIELD | Admitting: Internal Medicine

## 2015-08-30 ENCOUNTER — Encounter: Payer: Self-pay | Admitting: Internal Medicine

## 2015-08-30 VITALS — BP 100/78 | HR 92 | Ht 61.0 in | Wt 137.2 lb

## 2015-08-30 DIAGNOSIS — R1032 Left lower quadrant pain: Secondary | ICD-10-CM | POA: Diagnosis not present

## 2015-08-30 DIAGNOSIS — R194 Change in bowel habit: Secondary | ICD-10-CM

## 2015-08-30 DIAGNOSIS — R1031 Right lower quadrant pain: Secondary | ICD-10-CM | POA: Diagnosis not present

## 2015-08-30 NOTE — Progress Notes (Signed)
Cc: abdominal pain HPI:  [name] is a  45 y/o Caucasian female who was referred to me by Jill Noble, MD for a complaint of lower abdominal cramping pain. Please recall patient has been followed in our office recently for epigastric symptoms, she did have an EGD on 07/03/15 which revealed chronic gastritis. Biopsies were positive for H. pylori and she was started on a regimen of Pantoprazole, Pepto-Bismol, Metronidazole and Doxycycline.  After finishing treatment, the patient then had a H. pylori fecal test which returned negative on 08/17/15.  Today, the patient describes multiple episodes of lower abdominal cramping. She explains that similar symptoms did occur 20 years ago and she was evaluated with a colonoscopy and Jill Melendez, we do not have report of this today. The patient describes that since that time she continues with 7-8 episodes per year of lower abdominal cramping which she rates as "more severe than a 10/10", which brings her to her knees, crawled up in a ball. These tend to last 10 minutes and will then spontaneously resolve. During these episodes the patient has noted that her "hands and feet draw up and turn blue", her lips also turn blue. During this time the patient tries to pass stool and feels that once she has multiple bowel movements and is "emptied out", she feels much better. Patient does describe near syncope during these episodes. These episodes used to occur only around her period or ovulation or right after brief exercise episodes.   Most recently her last episode was on 08/15/15 and was abnormal in that it occurred immediately upon waking up at 5 AM and lasted for 30 minutes. The patient proceeded to her primary care provider at that time who did labs which were normal, per the patient and she was given an antispasmodic. She has had no further episodes since then and has not had a chance to try the new medicine. The patient does add that she typically uses Vitex to maintain  normal periods and was off of this medication while undergoing treatment for H. pylori. She wonders if this could have brought on her episode. She also tells me that she had previously had a steroid injection and was started on Prednisone 2 days before this episode.  Typically the patient does have a daily bowel movement, though tells me that this has changed over the past year. Previously she was having 5-6 loose stools a day, which she blames on a diagnosis of IBS-D, though for the past year she experiences only one "ribbonlike" stool per day.  Patient's social history is positive for drinking at least 80 ounces of water on a daily basis.  Patient denies nausea, vomiting, heartburn, reflux, excessive gas or bloating, symptoms that awaken her from sleep, constipation, fever, chills, blood in her stool, melenic-appearing stool, weight loss or anorexia.   Past Medical History  Diagnosis Date  . Migraine   . Anxiety   . Endometriosis   . Sepsis (HCC) 04/11/2014  . 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 Influenza due to influenza virus, type B 04/12/2014  . Allergy     seasonal    Past Surgical History  Procedure Laterality Date  . Breast surgery    . Breast lumpectomy Left 1994  . Colonoscopy    . Sigmoidoscopy    . Some upper gi test      pt states was not EGD    Current Outpatient Prescriptions  Medication Sig Dispense Refill  . Ascorbic Acid (VITAMIN C PO) Take 1  capsule by mouth daily.    Marland Kitchen. b complex vitamins tablet Take 1 tablet by mouth daily.    Marland Kitchen. bismuth subsalicylate (PEPTO-BISMOL) 262 MG chewable tablet Chew 2 tablets (524 mg total) by mouth 4 (four) times daily. 84 tablet 0  . Calcium Carbonate-Vit D-Min (CALCIUM 1200 PO) Take by mouth.    Gwenyth Ober. Chaste Tree (VITEX EXTRACT PO) Take by mouth.    . cholecalciferol (VITAMIN D) 1000 units tablet Take 1,000 Units by mouth daily.    . cyclobenzaprine (FLEXERIL) 10 MG tablet Take 10 mg by mouth as needed for muscle spasms.    Marland Kitchen.  dicyclomine (BENTYL) 20 MG tablet Take 1 tablet (20 mg total) by mouth every 6 (six) hours as needed for spasms (cramps). May take 1/2 tablet 60 tablet 0  . LORazepam (ATIVAN) 1 MG tablet Take 1 mg by mouth daily as needed for anxiety.    . Maca 500 MG CAPS Take by mouth.    . magnesium 30 MG tablet Take 30 mg by mouth 2 (two) times daily.    . Multiple Vitamin (MULTIVITAMIN) tablet Take 1 tablet by mouth daily.    . naproxen (NAPROSYN) 500 MG tablet Take 500 mg by mouth every 12 (twelve) hours as needed.    . Omega 3-6-9 Fatty Acids (OMEGA 3-6-9 COMPLEX PO) Take 1 capsule by mouth daily.    . pantoprazole (PROTONIX) 20 MG tablet Take 20 mg by mouth 2 (two) times daily.     . Probiotic Product (PROBIOTIC PO) Take 1 capsule by mouth daily.    . TURMERIC PO Take 1 capsule by mouth daily.    . vitamin E 100 UNIT capsule Take 100 Units by mouth daily.     No current facility-administered medications for this visit.    Allergies as of 08/30/2015 - Review Complete 08/30/2015  Allergen Reaction Noted  . Bee venom Swelling 07/03/2015  . Codeine Itching and Nausea And Vomiting 06/19/2014  . Latex Rash 07/03/2015    Family History  Problem Relation Age of Onset  . High blood pressure Mother   . Stroke Mother   . Colon polyps Mother   . Colon cancer Paternal Grandfather [id number]  . Esophageal cancer Neg Hx   . Rectal cancer Neg Hx   . Stomach cancer Neg Hx     Social History   Social History  . Marital Status: Single    Spouse Name: Jill Melendez   . Number of Children: 0  . Years of Education: GED   Occupational History  . [employer]    Social History Main Topics  . Smoking status: Never Smoker   . Smokeless tobacco: Never Used  . Alcohol Use: 0.6 oz/week    1 Cans of beer, 0 Standard drinks or equivalent per week     Comment: occ  . Drug Use: 1.00 per week     Comment: Marijuana   . Sexual Activity: Yes    Birth Control/ Protection: None   Other Topics Concern  . Not on  file   Social History Narrative   Lives at home with finance and step daughter.   Self-employed esthetician nail tech   Caffeine use: none. Stopped drinking any caffeine in 2014.     Review of Systems:    Constitutional: No weight loss, fever or chills HEENT: Eyes: No change in vision               Ears, Nose, Throat:  No change in hearing Skin:  No new rash  Cardiovascular: No chest pain or palpitations     Respiratory: No SOB Gastrointestinal: See HPI and otherwise negative Genitourinary: No dysuria Neurological: Positive for near syncope during pain episodes Musculoskeletal: Positive for muscle/back pain  Psychiatric: No history of depression or anxiety    Physical Exam:  Vital signs: BP 100/78 mmHg  Pulse 92  Ht 5\' 1"  (1.549 m)  Wt 137 lb 4 oz (62.256 kg)  BMI 25.95 kg/m2  LMP 08/25/2015  General:   Pleasant Caucasian female appears to be in NAD, Well developed, Well nourished, alert and cooperative Head:  Normocephalic and atraumatic. Eyes:   PEERL, EOMI. No icterus. Conjunctiva pink. Ears:  Normal auditory acuity. Neck:  Supple Throat: Oral cavity and pharynx without inflammation, swelling or lesion. Teeth in good condition. Lungs: Respirations even and unlabored. Lungs clear to auscultation bilaterally.   No wheezes, crackles, or rhonchi.  Heart: Normal S1, S2. No MRG. Regular rate and rhythm. No peripheral edema, cyanosis or pallor.  Abdomen:  Soft, nondistended, nontender. No rebound or guarding. Normal bowel sounds. No appreciable masses or hepatomegaly. Rectal:  Not performed.  Msk:  Symmetrical without gross deformities. Peripheral pulses intact.  Extremities:  Without edema, no deformity or joint abnormality.  Neurologic:  Alert and  oriented x4;  grossly normal neurologically.  Skin:   Dry and intact without significant lesions or rashes. Psychiatric: Oriented to person, place and time. Demonstrates good judgement and reason without abnormal affect or  behaviors.  RELEVANT LABS AND IMAGING: CBC    Component Value Date/Time   WBC 6.0 06/29/2015 1151   RBC 4.49 06/29/2015 1151   HGB 13.7 06/29/2015 1151   HCT 39.8 06/29/2015 1151   PLT 203.0 06/29/2015 1151   MCV 88.8 06/29/2015 1151   MCH 30.5 04/12/2014 0813   MCHC 34.3 06/29/2015 1151   RDW 11.9 06/29/2015 1151   LYMPHSABS 1.7 06/29/2015 1151   MONOABS 0.4 06/29/2015 1151   EOSABS 0.1 06/29/2015 1151   BASOSABS 0.0 06/29/2015 1151    CMP     Component Value Date/Time   NA 139 06/29/2015 1151   NA 141 06/19/2014 1026   K 4.1 06/29/2015 1151   CL 104 06/29/2015 1151   CO2 27 06/29/2015 1151   GLUCOSE 100* 06/29/2015 1151   GLUCOSE 87 06/19/2014 1026   BUN 15 06/29/2015 1151   BUN 16 06/19/2014 1026   CREATININE 0.78 06/29/2015 1151   CALCIUM 9.5 06/29/2015 1151   PROT 7.7 06/29/2015 1151   PROT 7.3 06/19/2014 1026   ALBUMIN 4.5 06/29/2015 1151   ALBUMIN 4.6 06/19/2014 1026   AST 13 06/29/2015 1151   ALT 11 06/29/2015 1151   ALKPHOS 41 06/29/2015 1151   BILITOT 1.2 06/29/2015 1151   BILITOT 0.7 06/19/2014 1026   GFRNONAA 105 06/19/2014 1026   GFRAA 121 06/19/2014 1026    Assessment: 1. Change in bowel habits: Patient describes a change in bowel habits from 5-6 loose stools per day to 1 "ribbon like" stool over the past year; consider IBS versus constipation versus other 2. Bilateral lower abdominal pain: Patient describes at least 8 episodes a year of debilitating lower abdominal cramping which lasts 10 minutes per episode and is not helped by anything, these are consistently brought on by exercise and are sometimes around her period/ovulation; consider endometriosis versus colonic spasm versus IBS versus nervous system disorder versus other  Plan: 1. Recommend proceeding with colonoscopy, discussed risks, benefits and limitations and alternatives of the patient today. She  agrees to proceed. 2. Recommend the patient trial her antispasmodic during an episode of  pain 3. Recommend the patient maintain a high-fiber diet, 25-35 g per day with the use of a fiber supplement and continue her water intake. 4. Patient will follow up in clinic as recommended after time of colonoscopy.  Hyacinth Meeker, PA-C [facility] 08/30/2015, 1:47 PM   Agree with Ms. Lenard Simmer management plan which we formulated together after seeing the patient together.  Iva Boop, MD, [facility]  Cc: Jill Noble, MD

## 2015-08-30 NOTE — Patient Instructions (Signed)

## 2015-08-31 ENCOUNTER — Encounter: Payer: Self-pay | Admitting: Internal Medicine

## 2015-08-31 ENCOUNTER — Ambulatory Visit: Payer: Self-pay | Admitting: Internal Medicine

## 2015-09-10 ENCOUNTER — Ambulatory Visit (AMBULATORY_SURGERY_CENTER): Payer: BLUE CROSS/BLUE SHIELD | Admitting: Internal Medicine

## 2015-09-10 ENCOUNTER — Encounter: Payer: Self-pay | Admitting: Internal Medicine

## 2015-09-10 VITALS — BP 120/86 | HR 73 | Temp 98.9°F | Resp 12 | Ht 61.0 in | Wt 137.0 lb

## 2015-09-10 DIAGNOSIS — R194 Change in bowel habit: Secondary | ICD-10-CM

## 2015-09-10 MED ORDER — SODIUM CHLORIDE 0.9 % IV SOLN: 500.0000 mL | INTRAVENOUS | Status: DC

## 2015-09-10 NOTE — Patient Instructions (Addendum)
The colon and end of small intestine look ok. I think you have IBS - Irritable bowel syndrome. I did take biopsies to look for any inflammation (miocroscopic) and will let you know.  I appreciate the opportunity to care for you. Iva Booparl E. Gessner, MD, FACG    YOU HAD AN ENDOSCOPIC PROCEDURE TODAY AT THE [facility]:   Refer to the procedure report that was given to you for any specific questions about what was found during the examination.  If the procedure report does not answer your questions, please call your gastroenterologist to clarify.  If you requested that your care partner not be given the details of your procedure findings, then the procedure report has been included in a sealed envelope for you to review at your convenience later.  YOU SHOULD EXPECT: Some feelings of bloating in the abdomen. Passage of more gas than usual.  Walking can help get rid of the air that was put into your GI tract during the procedure and reduce the bloating. If you had a lower endoscopy (such as a colonoscopy or flexible sigmoidoscopy) you may notice spotting of blood in your stool or on the toilet paper. If you underwent a bowel prep for your procedure, you may not have a normal bowel movement for a few days.  Please Note:  You might notice some irritation and congestion in your nose or some drainage.  This is from the oxygen used during your procedure.  There is no need for concern and it should clear up in a day or so.  SYMPTOMS TO REPORT IMMEDIATELY:   Following lower endoscopy (colonoscopy or flexible sigmoidoscopy):  Excessive amounts of blood in the stool  Significant tenderness or worsening of abdominal pains  Swelling of the abdomen that is new, acute  Fever of 100F or higher  For urgent or emergent issues, a gastroenterologist can be reached at any hour by calling [phone].   DIET: Your first meal following the procedure should be a small meal and then it is ok to  progress to your normal diet. Heavy or fried foods are harder to digest and may make you feel nauseous or bloated.  Likewise, meals heavy in dairy and vegetables can increase bloating.  Drink plenty of fluids but you should avoid alcoholic beverages for 24 hours.  ACTIVITY:  You should plan to take it easy for the rest of today and you should NOT DRIVE or use heavy machinery until tomorrow (because of the sedation medicines used during the test).    FOLLOW UP: Our staff will call the number listed on your records the next business day following your procedure to check on you and address any questions or concerns that you may have regarding the information given to you following your procedure. If we do not reach you, we will leave a message.  However, if you are feeling well and you are not experiencing any problems, there is no need to return our call.  We will assume that you have returned to your regular daily activities without incident.  If any biopsies were taken you will be contacted by phone or by letter within the next 1-3 weeks.  Please call us at [phone] if you have not heard about the biopsies in 3 weeks.    SIGNATURES/CONFIDENTIALITY: You and/or your care partner have signed paperwork which will be entered into your electronic medical record.  These signatures attest to the fact that that the information above on your  After Visit Summary has been reviewed and is understood.  Full responsibility of the confidentiality of this discharge information lies with you and/or your care-partner.

## 2015-09-10 NOTE — Progress Notes (Signed)
Report to PACU, RN, vss, BBS= Clear.  

## 2015-09-10 NOTE — Op Note (Signed)
[facility] Patient Name: Jill Melendez Procedure Date: 09/10/2015 12:57 PM MRN: [id number] Endoscopist: Iva Boop , MD Age: 45 Referring MD:  Date of Birth: [birth year] Gender: Female Account #: [id number] Procedure:                Colonoscopy Indications:              Clinically significant diarrhea of unexplained                            origin Medicines:                Propofol per Anesthesia, Monitored Anesthesia Care Procedure:                Pre-Anesthesia Assessment:                           - Prior to the procedure, a History and Physical                            was performed, and patient medications and                            allergies were reviewed. The patient's tolerance of                            previous anesthesia was also reviewed. The risks                            and benefits of the procedure and the sedation                            options and risks were discussed with the patient.                            All questions were answered, and informed consent                            was obtained. Prior Anticoagulants: The patient has                            taken no previous anticoagulant or antiplatelet                            agents. ASA Grade Assessment: II - A patient with                            mild systemic disease. After reviewing the risks                            and benefits, the patient was deemed in                            satisfactory condition to undergo the procedure.  After obtaining informed consent, the colonoscope                            was passed under direct vision. Throughout the                            procedure, the patient's blood pressure, pulse, and                            oxygen saturations were monitored continuously. The                            Model PCF-[id number]) scope was introduced                            through the anus and  advanced to the the terminal                            ileum, with identification of the appendiceal                            orifice and IC valve. The terminal ileum, ileocecal                            valve, appendiceal orifice, and rectum were                            photographed. The quality of the bowel preparation                            was excellent. The colonoscopy was performed                            without difficulty. The patient tolerated the                            procedure well. The bowel preparation used was                            Miralax. Scope In: 1:21:08 PM Scope Out: 1:28:09 PM Total Procedure Duration: 0 hours 7 minutes 1 second  Findings:                 The perianal and digital rectal examinations were                            normal.                           The colon (entire examined portion) appeared                            normal. Biopsies for histology were taken with a  cold forceps from the entire colon for evaluation                            of microscopic colitis. Estimated blood loss was                            minimal.                           No additional abnormalities were found on                            retroflexion. Complications:            No immediate complications. Estimated blood loss:                            None. Estimated Blood Loss:     Estimated blood loss: none. Recommendation:           - Repeat colonoscopy in 10 years for screening                            purposes.                           - Patient has a contact number available for                            emergencies. The signs and symptoms of potential                            delayed complications were discussed with the                            patient. Return to normal activities tomorrow.                            Written discharge instructions were provided to the                             patient.                           - Resume previous diet.                           - Continue present medications.                           - Await pathology results. Iva Booparl E Gessner, MD 09/10/2015 1:38:24 PM This report has been signed electronically.

## 2015-09-10 NOTE — Progress Notes (Signed)
Called to room to assist during endoscopic procedure.  Patient ID and intended procedure confirmed with present staff. Received instructions for my participation in the procedure from the performing physician.  

## 2015-09-11 ENCOUNTER — Telehealth: Payer: Self-pay

## 2015-09-11 NOTE — Telephone Encounter (Signed)
  Follow up Call-  Call back number 09/10/2015 07/03/2015  Post procedure Call Back phone  # [phone]  Permission to leave phone message Yes Yes    Patient was called for follow up after her procedure on 09/10/2015. No answer at the number given for follow up phone call. A message was left on the answering machine.

## 2015-09-14 NOTE — Progress Notes (Signed)
Quick Note:  My Chart message sent 10 year recall ______

## 2016-02-20 IMAGING — CR DG CHEST 1V PORT
1 series · 1 of 1 positions shown · non-contrast
Comparison: None.

CLINICAL DATA: Cough and fever for 2 days

EXAM:
PORTABLE CHEST - 1 VIEW

[AP]
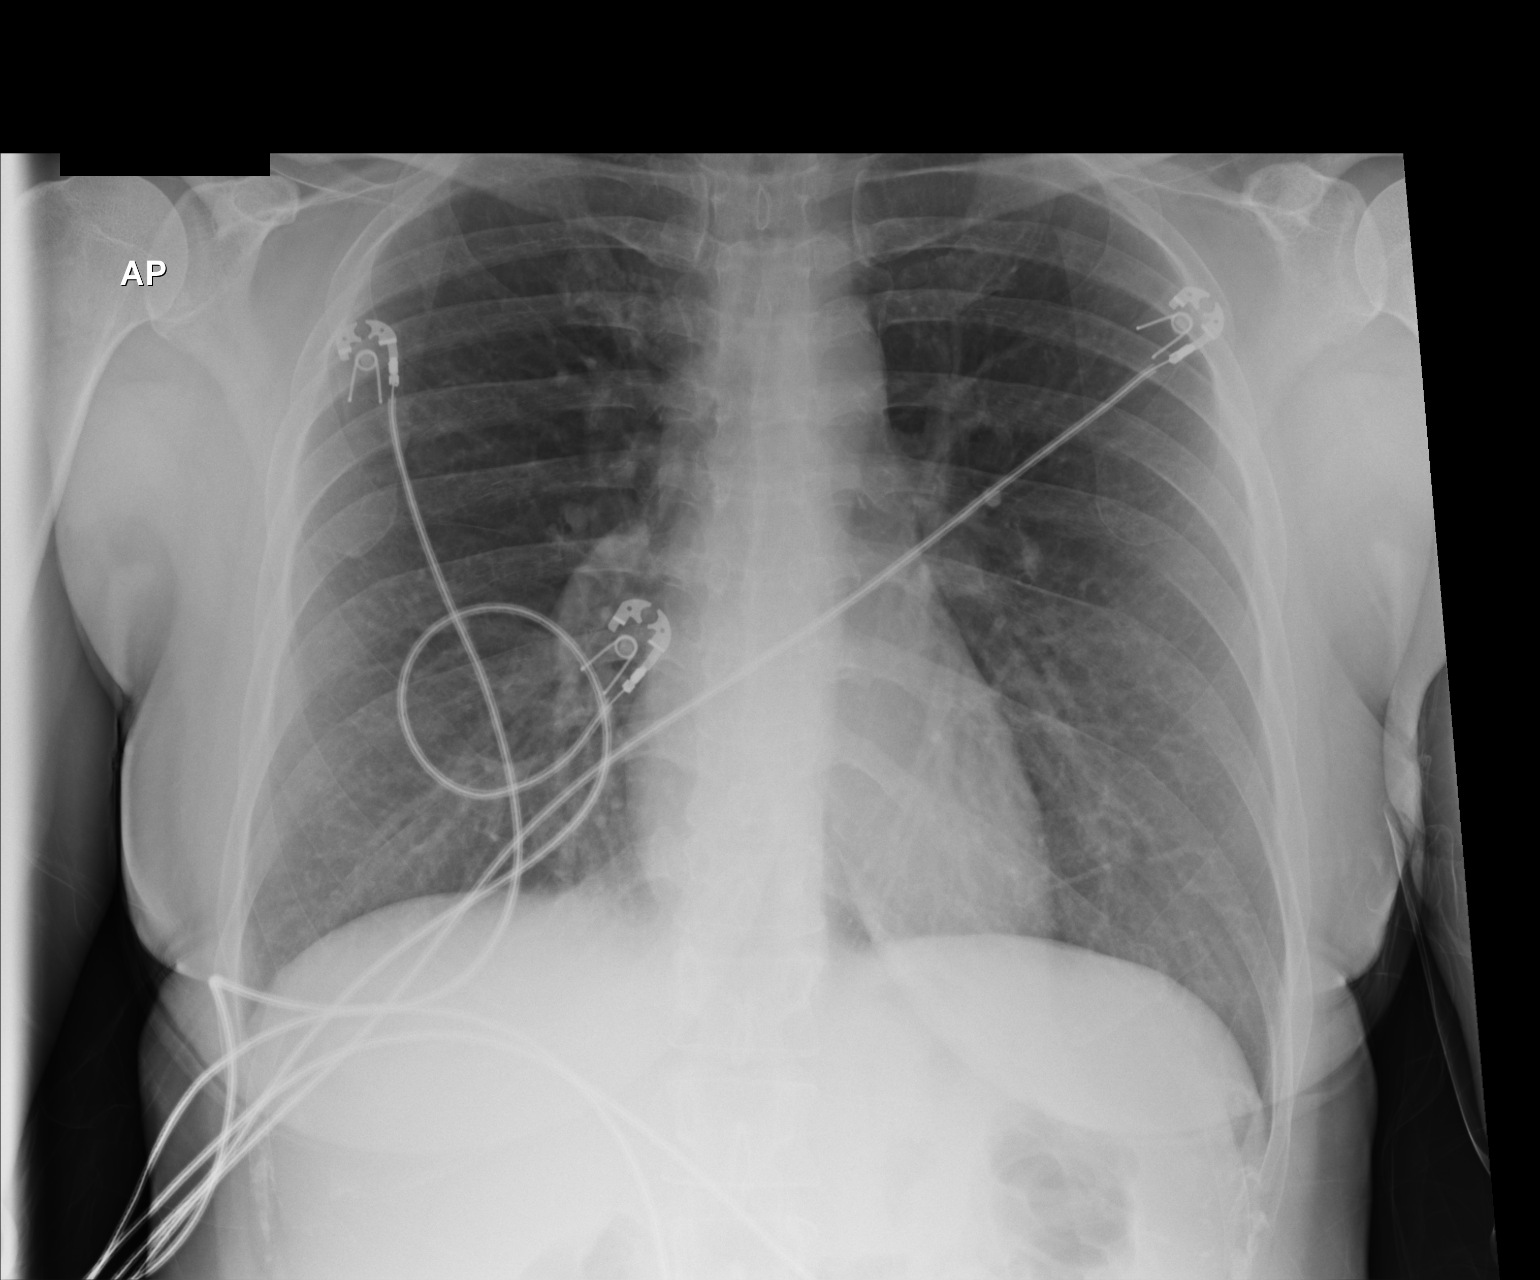

[1 of 1 positions shown; findings below may reference images not displayed]

FINDINGS: The heart size and mediastinal contours are within normal limits.
Both lungs are clear. The visualized skeletal structures are
unremarkable.
IMPRESSION: No active disease.

## 2016-03-17 ENCOUNTER — Other Ambulatory Visit (HOSPITAL_COMMUNITY)
Admission: RE | Admit: 2016-03-17 | Discharge: 2016-03-17 | Disposition: A | Discharge status: Home / Self Care | Payer: BLUE CROSS/BLUE SHIELD | Source: Ambulatory Visit | Attending: Obstetrics & Gynecology | Admitting: Obstetrics & Gynecology

## 2016-03-17 ENCOUNTER — Other Ambulatory Visit: Payer: Self-pay | Admitting: Obstetrics & Gynecology

## 2016-03-17 DIAGNOSIS — Z01419 Encounter for gynecological examination (general) (routine) without abnormal findings: Secondary | ICD-10-CM | POA: Insufficient documentation

## 2016-03-17 DIAGNOSIS — Z1151 Encounter for screening for human papillomavirus (HPV): Secondary | ICD-10-CM | POA: Insufficient documentation

## 2016-03-20 LAB — CYTOLOGY - PAP
ADEQUACY: ABSENT
Diagnosis: NEGATIVE
HPV (WINDOPATH): NOT DETECTED

## 2016-05-07 ENCOUNTER — Other Ambulatory Visit: Payer: Self-pay | Admitting: Internal Medicine

## 2016-05-07 DIAGNOSIS — Z1231 Encounter for screening mammogram for malignant neoplasm of breast: Secondary | ICD-10-CM

## 2016-05-27 ENCOUNTER — Ambulatory Visit
Admission: RE | Admit: 2016-05-27 | Discharge: 2016-05-27 | Disposition: A | Discharge status: Home / Self Care | Payer: Self-pay | Source: Ambulatory Visit | Attending: Internal Medicine | Admitting: Internal Medicine

## 2016-05-27 DIAGNOSIS — Z1231 Encounter for screening mammogram for malignant neoplasm of breast: Secondary | ICD-10-CM

## 2017-07-13 ENCOUNTER — Other Ambulatory Visit: Payer: Self-pay | Admitting: Internal Medicine

## 2017-07-13 DIAGNOSIS — Z1231 Encounter for screening mammogram for malignant neoplasm of breast: Secondary | ICD-10-CM

## 2017-08-06 ENCOUNTER — Ambulatory Visit
Admission: RE | Admit: 2017-08-06 | Discharge: 2017-08-06 | Disposition: A | Discharge status: Home / Self Care | Payer: BLUE CROSS/BLUE SHIELD | Source: Ambulatory Visit | Attending: Internal Medicine | Admitting: Internal Medicine

## 2017-08-06 DIAGNOSIS — Z1231 Encounter for screening mammogram for malignant neoplasm of breast: Secondary | ICD-10-CM

## 2018-08-20 ENCOUNTER — Other Ambulatory Visit: Payer: Self-pay | Admitting: *Deleted

## 2018-08-20 DIAGNOSIS — Z20822 Contact with and (suspected) exposure to covid-19: Secondary | ICD-10-CM

## 2018-08-25 LAB — NOVEL CORONAVIRUS, NAA: SARS-CoV-2, NAA: NOT DETECTED

## 2019-03-08 ENCOUNTER — Other Ambulatory Visit: Payer: Self-pay | Admitting: Obstetrics & Gynecology

## 2019-03-08 DIAGNOSIS — Z1231 Encounter for screening mammogram for malignant neoplasm of breast: Secondary | ICD-10-CM

## 2019-04-14 ENCOUNTER — Ambulatory Visit: Payer: BLUE CROSS/BLUE SHIELD

## 2019-05-18 ENCOUNTER — Ambulatory Visit
Admission: RE | Admit: 2019-05-18 | Discharge: 2019-05-18 | Disposition: A | Discharge status: Home / Self Care | Payer: 59 | Source: Ambulatory Visit | Attending: Obstetrics & Gynecology | Admitting: Obstetrics & Gynecology

## 2019-05-18 ENCOUNTER — Other Ambulatory Visit: Payer: Self-pay

## 2019-05-18 DIAGNOSIS — Z1231 Encounter for screening mammogram for malignant neoplasm of breast: Secondary | ICD-10-CM

## 2020-03-02 ENCOUNTER — Other Ambulatory Visit (HOSPITAL_COMMUNITY): Payer: Self-pay | Admitting: Internal Medicine

## 2020-05-04 ENCOUNTER — Other Ambulatory Visit: Payer: Self-pay | Admitting: Internal Medicine

## 2020-05-04 ENCOUNTER — Other Ambulatory Visit (HOSPITAL_COMMUNITY): Payer: Self-pay | Admitting: Internal Medicine

## 2020-05-04 ENCOUNTER — Ambulatory Visit
Admission: RE | Admit: 2020-05-04 | Discharge: 2020-05-04 | Disposition: A | Discharge status: Home / Self Care | Payer: 59 | Source: Ambulatory Visit | Attending: Internal Medicine | Admitting: Internal Medicine

## 2020-05-04 DIAGNOSIS — M5412 Radiculopathy, cervical region: Secondary | ICD-10-CM

## 2020-05-14 ENCOUNTER — Other Ambulatory Visit: Payer: Self-pay | Admitting: Internal Medicine

## 2020-05-14 DIAGNOSIS — Z1231 Encounter for screening mammogram for malignant neoplasm of breast: Secondary | ICD-10-CM

## 2020-07-05 ENCOUNTER — Ambulatory Visit: Payer: 59

## 2020-07-05 ENCOUNTER — Other Ambulatory Visit: Payer: Self-pay

## 2020-07-05 ENCOUNTER — Ambulatory Visit
Admission: RE | Admit: 2020-07-05 | Discharge: 2020-07-05 | Disposition: A | Discharge status: Home / Self Care | Payer: 59 | Source: Ambulatory Visit | Attending: Internal Medicine | Admitting: Internal Medicine

## 2020-07-05 DIAGNOSIS — Z1231 Encounter for screening mammogram for malignant neoplasm of breast: Secondary | ICD-10-CM

## 2021-01-10 ENCOUNTER — Other Ambulatory Visit: Payer: Self-pay | Admitting: Internal Medicine

## 2021-01-10 ENCOUNTER — Ambulatory Visit
Admission: RE | Admit: 2021-01-10 | Discharge: 2021-01-10 | Disposition: A | Discharge status: Home / Self Care | Payer: 59 | Source: Ambulatory Visit | Attending: Internal Medicine | Admitting: Internal Medicine

## 2021-01-10 DIAGNOSIS — R0781 Pleurodynia: Secondary | ICD-10-CM

## 2021-02-26 ENCOUNTER — Encounter (HOSPITAL_BASED_OUTPATIENT_CLINIC_OR_DEPARTMENT_OTHER): Payer: Self-pay

## 2021-02-26 ENCOUNTER — Other Ambulatory Visit: Payer: Self-pay

## 2021-02-26 ENCOUNTER — Emergency Department (HOSPITAL_BASED_OUTPATIENT_CLINIC_OR_DEPARTMENT_OTHER): Payer: 59 | Admitting: Radiology

## 2021-02-26 ENCOUNTER — Emergency Department (HOSPITAL_BASED_OUTPATIENT_CLINIC_OR_DEPARTMENT_OTHER)
Admission: EM | Admit: 2021-02-26 | Discharge: 2021-02-26 | Disposition: A | Discharge status: Home / Self Care | Payer: 59 | Attending: Emergency Medicine | Admitting: Emergency Medicine

## 2021-02-26 DIAGNOSIS — R0789 Other chest pain: Secondary | ICD-10-CM | POA: Diagnosis present

## 2021-02-26 DIAGNOSIS — U071 COVID-19: Secondary | ICD-10-CM | POA: Diagnosis not present

## 2021-02-26 DIAGNOSIS — Z91040 Latex allergy status: Secondary | ICD-10-CM | POA: Insufficient documentation

## 2021-02-26 DIAGNOSIS — R079 Chest pain, unspecified: Secondary | ICD-10-CM

## 2021-02-26 LAB — TROPONIN I (HIGH SENSITIVITY): Troponin I (High Sensitivity): 2 ng/L (ref <18)

## 2021-02-26 LAB — BASIC METABOLIC PANEL
Anion gap: 8 (ref 5–15)
BUN: 11 mg/dL (ref 6–20)
CO2: 27 mmol/L (ref 22–32)
Calcium: 9.3 mg/dL (ref 8.9–10.3)
Chloride: 104 mmol/L (ref 98–111)
Creatinine, Ser: 0.59 mg/dL (ref 0.44–1.00)
GFR, Estimated: >60 mL/min (ref >60)
Glucose, Bld: 90 mg/dL (ref 70–99)
Potassium: 4.2 mmol/L (ref 3.5–5.1)
Sodium: 139 mmol/L (ref 135–145)

## 2021-02-26 LAB — CBC
HCT: 38.3 % (ref 36.0–46.0)
Hemoglobin: 13.4 g/dL (ref 12.0–15.0)
MCH: 30.5 pg (ref 26.0–34.0)
MCHC: 35 g/dL (ref 30.0–36.0)
MCV: 87 fL (ref 80.0–100.0)
Platelets: 215 10*3/uL (ref 150–400)
RBC: 4.4 MIL/uL (ref 3.87–5.11)
RDW: 11.6 % (ref 11.5–15.5)
WBC: 5.3 10*3/uL (ref 4.0–10.5)
nRBC: 0 % (ref 0.0–0.2)

## 2021-02-26 NOTE — ED Triage Notes (Signed)
Patient here POV from Home with CP.  Patient was diagnosed with COVID-19 on 02/17/21 and began to Have a Dry Cough, Headaches, Fever, Body Aches.   Patient presents to ED today due to not feeling better and experiencing CP last PM.  NAD Noted during Triage. A&Ox4. GCS 15. Ambulatory.

## 2021-02-26 NOTE — Discharge Instructions (Signed)
Take tylenol 2 pills 4 times a day and motrin 4 pills 3 times a day.  Drink plenty of fluids.  Return for worsening shortness of breath, headache, confusion. Follow up with your family doctor.   

## 2021-02-26 NOTE — ED Provider Notes (Signed)
[facility] EMERGENCY DEPT Provider Note   CSN: [id number] Arrival date & time: 02/26/21  1453     History  Chief Complaint  Patient presents with   Chest Pain    Jill Melendez is a 51 y.o. female.  51 yo F with a cc of as chief complaint of left-sided chest discomfort.  The patient was diagnosed with COVID right around Christmas.  Since then has continued to feel unwell.  Developed some chest discomfort today.  Seems to come and go.  Mostly left-sided and pinpoint.  Nothing seems to make it better or worse.  She denies significant cough.  He like she is losing her voice.  She expected that she be better at this time but does not feel any better.  She called her family doctor who suggested she come to the ED for evaluation.  Whole family is sick with [place].  Her husband is here who is been starting feel a bit better.  The history is provided by the patient and the spouse.  Chest Pain Pain location:  L chest Pain quality: aching   Pain radiates to:  Does not radiate Pain severity:  Moderate Onset quality:  Gradual Duration:  2 weeks Timing:  Constant Progression:  Worsening Chronicity:  New Associated symptoms: no dizziness, no fever, no headache, no nausea, no palpitations, no shortness of breath and no vomiting       Home Medications Prior to Admission medications   Medication Sig Start Date End Date Taking? Authorizing Provider  Ascorbic Acid (VITAMIN C PO) Take 1 capsule by mouth daily.    [provider]  b complex vitamins tablet Take 1 tablet by mouth daily.    [provider]  Calcium Carbonate-Vit D-Min (CALCIUM 1200 PO) Take by mouth.    [provider]  Chaste Tree (VITEX EXTRACT PO) Take by mouth.    [provider]  cholecalciferol (VITAMIN D) 1000 units tablet Take 1,000 Units by mouth daily.    [provider]  cyclobenzaprine (FLEXERIL) 10 MG tablet Take 10 mg by mouth as needed for muscle spasms.     [provider]  cyclobenzaprine (FLEXERIL) 5 MG tablet TAKE 1 TABLET BY MOUTH EVERY 8 HOURS AS NEEDED FOR 10 DAYS 05/04/20 05/04/21  Sabra Heck, [place] E, PA  ketorolac (ACULAR) 0.5 % ophthalmic solution INSTILL 1 DROP INTO AFFECTED EYE AS NEEDED 4 TIMES A DAY FOR 5 DAYS 03/02/20 03/02/21  Josetta Huddle, MD  LORazepam (ATIVAN) 1 MG tablet Take 1 mg by mouth daily as needed for anxiety. Reported on 09/10/2015    [provider]  Maca 500 MG CAPS Take by mouth.    [provider]  magnesium 30 MG tablet Take 30 mg by mouth 2 (two) times daily.    [provider]  Multiple Vitamin (MULTIVITAMIN) tablet Take 1 tablet by mouth daily.    [provider]  naproxen (NAPROSYN) 500 MG tablet Take 500 mg by mouth every 12 (twelve) hours as needed. Reported on 09/10/2015    [provider]  Omega 3-6-9 Fatty Acids (OMEGA 3-6-9 COMPLEX PO) Take 1 capsule by mouth daily.    [provider]  prednisoLONE acetate (PRED FORTE) 1 % ophthalmic suspension PLACE 1 DROP INTO AFFECTED EYE TWICE A DAY 5 DAYS 03/02/20 03/02/21  Josetta Huddle, MD  predniSONE (DELTASONE) 5 MG tablet TAKE 6 TABS BY MOUTH ON DAY 1; 5 TABS ON DAY 2; 4 TABS ON DAY 3; 3 TABS ON DAY 4;  2 TABS ON DAY 5; 1 TAB ON DAY 6 THEN STOP 05/04/20 05/04/21  Sabra Heck, [place] E, [place]  Probiotic Product (PROBIOTIC PO) Take 1 capsule by mouth daily.    [provider]  tetracaine (PONTOCAINE) 0.5 % ophthalmic solution PLACE 1 DROP IN AFFECTED EYE AS NEEDED 5 DAYS 03/02/20 03/02/21  Josetta Huddle, MD  TURMERIC PO Take 1 capsule by mouth daily.    [provider]  vitamin E 100 UNIT capsule Take 100 Units by mouth daily.    [provider]      Allergies    Advair diskus [fluticasone-salmeterol], Bee venom, Codeine, and Latex    Review of Systems   Review of Systems  Constitutional:  Negative for chills and fever.  HENT:  Negative for congestion and rhinorrhea.   Eyes:  Negative for redness  and visual disturbance.  Respiratory:  Negative for shortness of breath and wheezing.   Cardiovascular:  Positive for chest pain. Negative for palpitations.  Gastrointestinal:  Negative for nausea and vomiting.  Genitourinary:  Negative for dysuria and urgency.  Musculoskeletal:  Negative for arthralgias and myalgias.  Skin:  Negative for pallor and wound.  Neurological:  Negative for dizziness and headaches.   Physical Exam Updated Vital Signs BP 121/86    Pulse 62    Temp 98.1 F (36.7 C) (Oral)    Resp 13    Ht 5\' 1"  (1.549 m)    Wt 62.1 kg    SpO2 100%    BMI 25.87 kg/m  Physical Exam Vitals and nursing note reviewed.  Constitutional:      General: She is not in acute distress.    Appearance: She is well-developed. She is not diaphoretic.  HENT:     Head: Normocephalic and atraumatic.  Eyes:     Pupils: Pupils are equal, round, and reactive to light.  Cardiovascular:     Rate and Rhythm: Normal rate and regular rhythm.     Heart sounds: No murmur heard.   No friction rub. No gallop.  Pulmonary:     Effort: Pulmonary effort is normal.     Breath sounds: No wheezing or rales.  Chest:     Chest wall: Tenderness present.  Abdominal:     General: There is no distension.     Palpations: Abdomen is soft.     Tenderness: There is no abdominal tenderness.  Musculoskeletal:        General: No tenderness.     Cervical back: Normal range of motion and neck supple.  Skin:    General: Skin is warm and dry.  Neurological:     Mental Status: She is alert and oriented to person, place, and time.  Psychiatric:        Behavior: Behavior normal.    ED Results / Procedures / Treatments   Labs (all labs ordered are listed, but only abnormal results are displayed) Labs Reviewed  BASIC METABOLIC PANEL  CBC  PREGNANCY, URINE  TROPONIN I (HIGH SENSITIVITY)  TROPONIN I (HIGH SENSITIVITY)    EKG EKG Interpretation  Date/Time:  Tuesday February 26 2021 15:46:00 EST Ventricular  Rate:  75 PR Interval:  124 QRS Duration: 74 QT Interval:  378 QTC Calculation: 422 R Axis:   87 Text Interpretation: Normal sinus rhythm Normal ECG No previous ECGs available No old tracing to compare Confirmed by Deno Etienne [id number]) on 02/26/2021 6:48:51 PM  Radiology DG Chest 2 View  Result Date: 02/26/2021 CLINICAL DATA:  Chest pain EXAM: CHEST -  2 VIEW COMPARISON:  Chest x-ray 01/10/2021 FINDINGS: Heart size and mediastinal contours are within normal limits. No suspicious pulmonary opacities identified. No pleural effusion or pneumothorax visualized. No acute osseous abnormality appreciated. IMPRESSION: No acute intrathoracic process identified. Electronically Signed   By: Ofilia Neas M.D.   On: 02/26/2021 16:01    Procedures Procedures    Medications Ordered in ED Medications - No data to display  ED Course/ Medical Decision Making/ A&P                           Medical Decision Making  51 yo F with a chief complaint of left-sided chest pain.  This is atypical in nature and partially reproduced on exam.  She has COVID.  I feel this is very unlikely to be a pulmonary embolism.  Troponin negative EKG without concerning finding chest x-ray viewed by me without focal infiltrate or pneumothorax.  No significant anemia no significant electrolyte abnormality.  We will have the patient follow-up with her family doctor in the office.  7:51 PM:  I have discussed the diagnosis/risks/treatment options with the patient and believe the pt to be eligible for discharge home to follow-up with PCP. We also discussed returning to the ED immediately if new or worsening sx occur. We discussed the sx which are most concerning (e.g., sudden worsening pain, fever, inability to tolerate by mouth) that necessitate immediate return. Medications administered to the patient during their visit and any new prescriptions provided to the patient are listed below.  Medications given during this visit Medications  - No data to display   The patient appears reasonably screen and/or stabilized for discharge and I doubt any other medical condition or other [facility] requiring further screening, evaluation, or treatment in the ED at this time prior to discharge.          Final Clinical Impression(s) / ED Diagnoses Final diagnoses:  COVID-19 virus infection  Nonspecific chest pain    Rx / DC Orders ED Discharge Orders     None         Deno Etienne, DO 02/26/21 1951

## 2021-06-10 ENCOUNTER — Ambulatory Visit: Payer: 59 | Admitting: Obstetrics & Gynecology

## 2021-06-10 ENCOUNTER — Encounter: Payer: Self-pay | Admitting: Obstetrics & Gynecology

## 2021-06-10 VITALS — BP 103/66 | HR 71 | Ht 61.0 in | Wt 120.4 lb

## 2021-06-10 DIAGNOSIS — Z0000 Encounter for general adult medical examination without abnormal findings: Secondary | ICD-10-CM | POA: Diagnosis not present

## 2021-06-10 DIAGNOSIS — Z1231 Encounter for screening mammogram for malignant neoplasm of breast: Secondary | ICD-10-CM | POA: Diagnosis not present

## 2021-06-10 DIAGNOSIS — Z01419 Encounter for gynecological examination (general) (routine) without abnormal findings: Secondary | ICD-10-CM

## 2021-06-10 NOTE — Addendum Note (Signed)
Addended by: Sharon Seller on: 06/10/2021 02:27 PM ? ? Modules accepted: Orders ? ?

## 2021-06-10 NOTE — Progress Notes (Signed)
? ?  WELL-WOMAN EXAMINATION ?Patient name: Jill Melendez MRN [id number]  Date of birth: February 15, 1971 ?Chief Complaint:   ?get established (Annual exam) ? ?History of Present Illness:   ?Jill Melendez is a 51 y.o. G0P0000 perimenopausal female being seen today for a routine well-woman exam.  ? ?Prior [facility] patient- records obtained. ? ?Prior h/o pelvic pain and suspected endometriosis- no longer an issue.  D/C [employer]. ? ?Occasional hot flash, but minimal. No supplements pertaining to this currently. ? ?Patient's last menstrual period was 10/02/2020. ?Last period more than 200 days ago ?The current method of family planning is coitus interruptus.  ? ? ?Last pap 02/2019 neg.  ?Last mammogram: 06/2020 cat. I. ?Last colonoscopy: 2017 ? ? ?  06/10/2021  ?  9:16 AM  ?Depression screen PHQ 2/9  ?Decreased Interest 0  ?Down, Depressed, Hopeless 0  ?PHQ - 2 Score 0  ?Altered sleeping 0  ?Tired, decreased energy 0  ?Change in appetite 0  ?Feeling bad or failure about yourself  0  ?Trouble concentrating 0  ?Moving slowly or fidgety/restless 0  ?Suicidal thoughts 0  ?PHQ-9 Score 0  ? ? ? ? ?Review of Systems:   ?Pertinent items are noted in HPI ?Denies any headaches, blurred vision, fatigue, shortness of breath, chest pain, abdominal pain, bowel movements, urination, or intercourse unless otherwise stated above. ? ?Pertinent History Reviewed:  ?Reviewed past medical,surgical, social and family history.  ?Reviewed problem list, medications and allergies. ?Physical Assessment:  ? ?Vitals:  ? 06/10/21 0859  ?BP: 103/66  ?Pulse: 71  ?Weight: 120 lb 6.4 oz (54.6 kg)  ?Height: 5\' 1"  (1.549 m)  ?Body mass index is 22.75 kg/m?. ?  ?     Physical Examination:  ? General appearance - well appearing, and in no distress ? Mental status - alert, oriented to person, place, and time ? Psych:  She has a normal mood and affect ? Skin - warm and dry, normal color, no suspicious lesions noted ? Chest - effort normal, all lung fields clear to auscultation  bilaterally ? Heart - normal rate and regular rhythm ? Neck:  midline trachea, no thyromegaly or nodules ? Breasts - breasts appear normal, no suspicious masses, no skin or nipple changes or  axillary nodes ? Abdomen - soft, nontender, nondistended, no masses or organomegaly ? Pelvic - VULVA: normal appearing vulva with no masses, tenderness or lesions  VAGINA: normal appearing vagina with normal color and discharge, no lesions  CERVIX: normal appearing cervix without discharge or lesions, no CMT ? UTERUS: uterus is felt to be normal size, shape, consistency and nontender  ? ADNEXA: No adnexal masses or tenderness noted. ? Extremities:  No swelling or varicosities noted ? ?Chaperone:  pt declined    ? ? ?Assessment & Plan:  ?1) Well-Woman Exam ?-reviewed screening guidelines, Pap due 2024 ?-mammogram ordered ?-colonoscopy up to date ? ? ?Meds: No orders of the defined types were placed in this encounter. ? ? ?Follow-up: Return in about 1 year (around 06/11/2022) for Annual. ? ? ?06/13/2022, DO ?Attending Obstetrician & Gynecologist, Faculty Practice ?[facility] for [facility], [facility] ? ? ?

## 2021-10-01 ENCOUNTER — Encounter: Payer: Self-pay | Admitting: Obstetrics & Gynecology

## 2022-02-07 ENCOUNTER — Ambulatory Visit
Admission: RE | Admit: 2022-02-07 | Discharge: 2022-02-07 | Disposition: A | Discharge status: Home / Self Care | Payer: Commercial Managed Care - HMO | Source: Ambulatory Visit | Attending: Obstetrics & Gynecology | Admitting: Obstetrics & Gynecology

## 2022-02-07 DIAGNOSIS — Z1231 Encounter for screening mammogram for malignant neoplasm of breast: Secondary | ICD-10-CM

## 2022-03-15 IMAGING — CR DG CERVICAL SPINE COMPLETE 4+V
5 series · 5 of 5 positions shown · non-contrast
Comparison: None.

CLINICAL DATA: Cervical radiculopathy. Symptoms for 1 month. No
known injury.

EXAM:
CERVICAL SPINE - COMPLETE 4+ VIEW

[w c-spine lat]
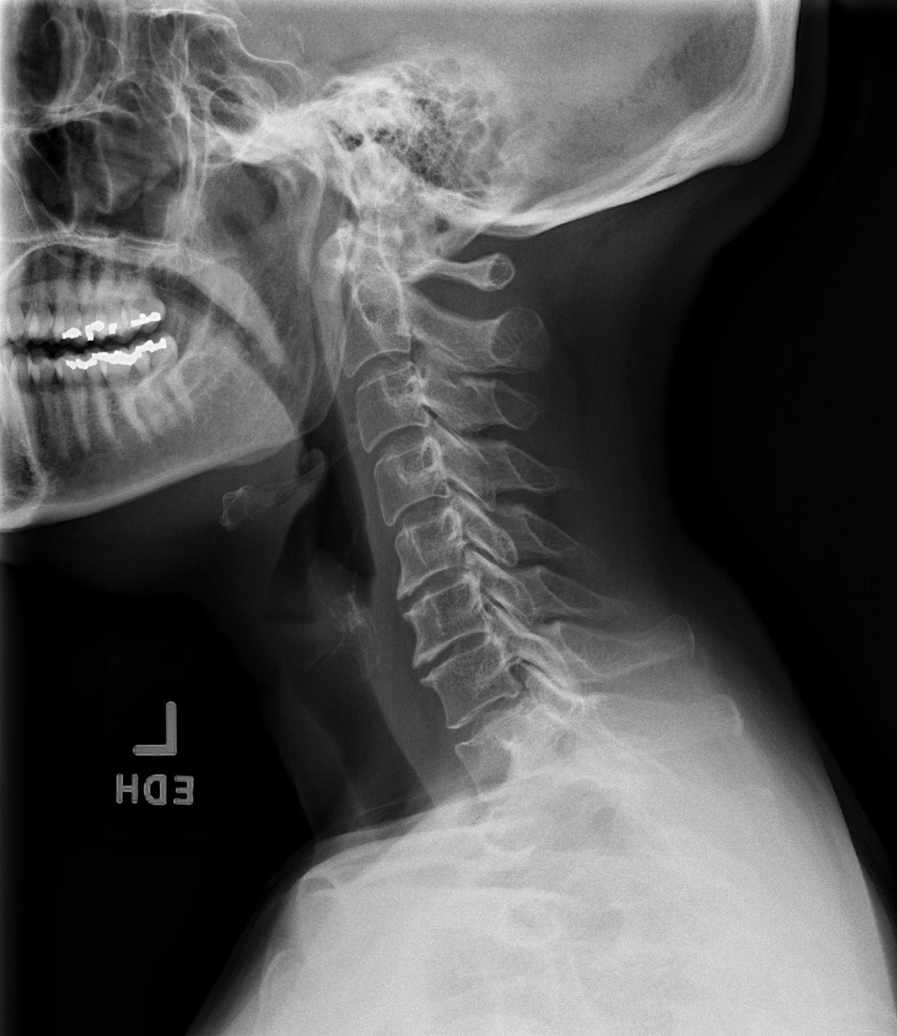

[w c-spine oblique (1 of 2)]
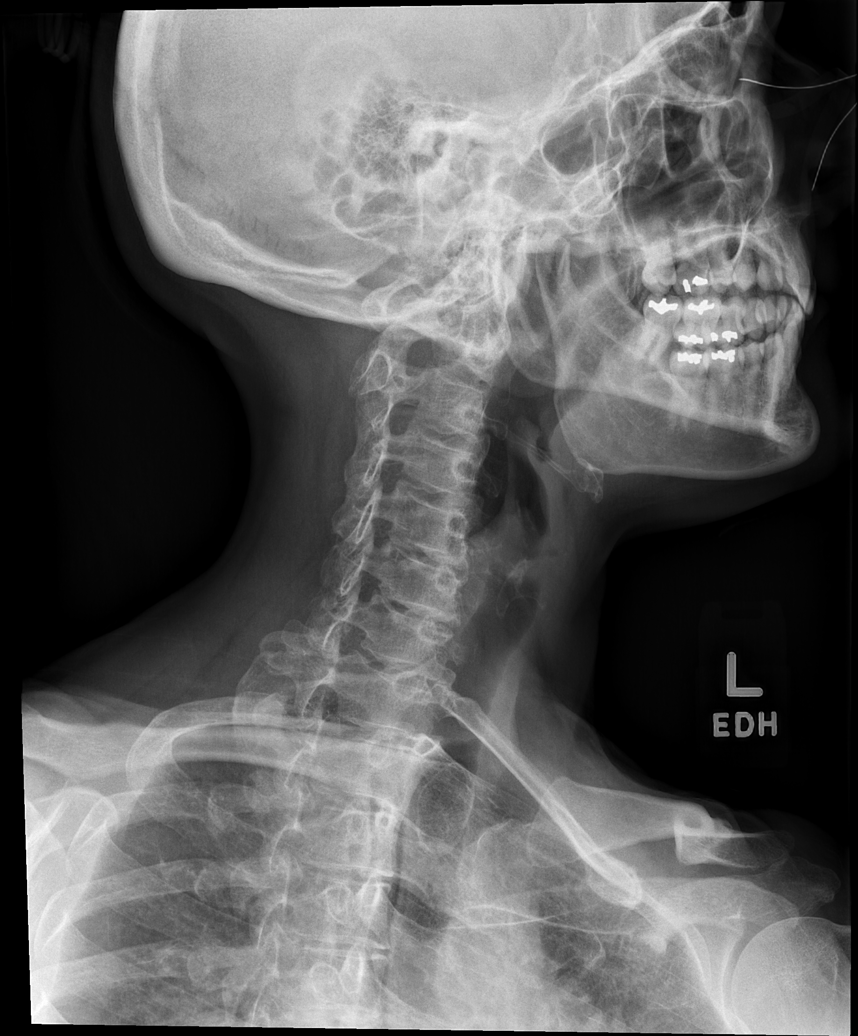

[w c-spine oblique (2 of 2)]
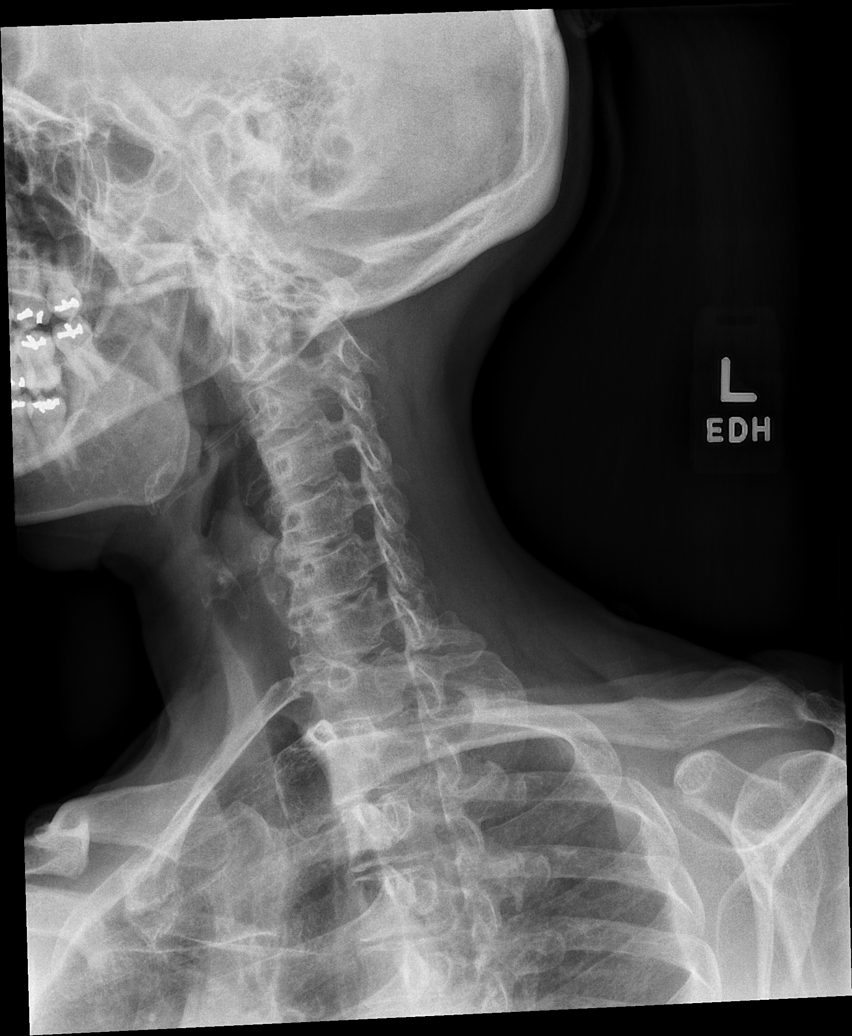

[w c-spine a.p. *]
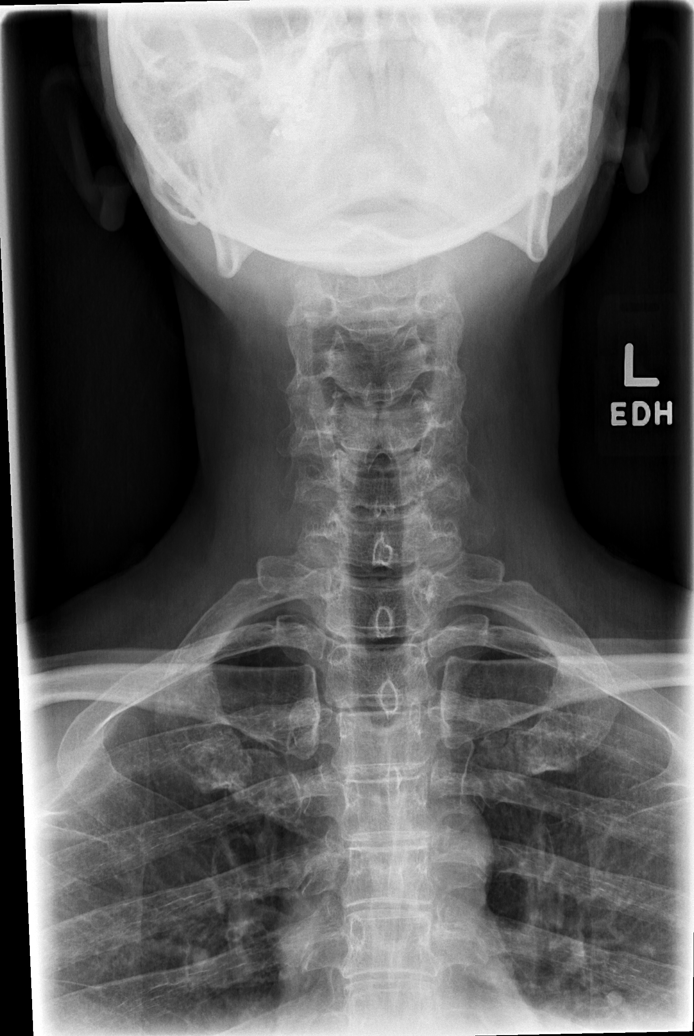

[w c-spine odontoid *]
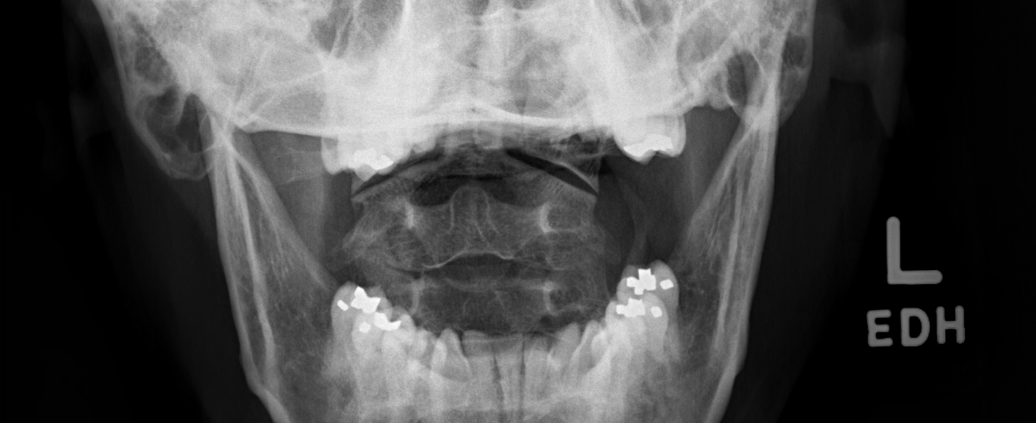

[5 of 5 positions shown; findings below may reference images not displayed]

FINDINGS: AP, lateral, open-mouth odontoid and bilateral oblique views
obtained. Normal alignment. No listhesis. Vertebral body heights are
normal. Mild disc space narrowing and anterior spurring at C5-C6 and
C6-C7. There may be minimal bony neural foraminal stenosis at C6-C7
on the left. No evidence of fracture, focal lesion or bone
destruction. No prevertebral soft tissue edema.
IMPRESSION: Mild degenerative disc disease at C5-C6 and C6-C7. Possible minimal
bony neural foraminal stenosis at C6-C7 on the left.

## 2022-05-26 DIAGNOSIS — M65311 Trigger thumb, right thumb: Secondary | ICD-10-CM | POA: Diagnosis not present

## 2022-05-26 DIAGNOSIS — M65312 Trigger thumb, left thumb: Secondary | ICD-10-CM | POA: Diagnosis not present

## 2022-06-13 ENCOUNTER — Encounter: Payer: Self-pay | Admitting: Obstetrics & Gynecology

## 2022-06-13 ENCOUNTER — Other Ambulatory Visit (HOSPITAL_COMMUNITY)
Admission: RE | Admit: 2022-06-13 | Discharge: 2022-06-13 | Disposition: A | Discharge status: Home / Self Care | Payer: 59 | Source: Ambulatory Visit | Attending: Obstetrics & Gynecology | Admitting: Obstetrics & Gynecology

## 2022-06-13 ENCOUNTER — Ambulatory Visit (INDEPENDENT_AMBULATORY_CARE_PROVIDER_SITE_OTHER): Payer: 59 | Admitting: Obstetrics & Gynecology

## 2022-06-13 VITALS — BP 99/56 | HR 72 | Ht 61.0 in | Wt 121.6 lb

## 2022-06-13 DIAGNOSIS — Z01419 Encounter for gynecological examination (general) (routine) without abnormal findings: Secondary | ICD-10-CM

## 2022-06-13 DIAGNOSIS — R233 Spontaneous ecchymoses: Secondary | ICD-10-CM

## 2022-06-13 NOTE — Progress Notes (Signed)
   WELL-WOMAN EXAMINATION Patient name: Jill Melendez MRN [id number]  Date of birth: 1970-08-06 Chief Complaint:   Gynecologic Exam  History of Present Illness:   Jill Melendez is a 52 y.o. G1P0010 PM female being seen today for a routine well-woman exam.  Today she notes no acute complaints or concerns.  After sex, her husband will note a small abrasion.  He was seen by his doctor and was prescribed Valtrex.  She notes this "outbreak "happens every time after intercourse.  She denies ever having an outbreak.  She denies vaginal discharge itching or irritation.  Patient requesting HSV testing today   -menopausal  Last pap due today.  Last mammogram: 01-2022. Last colonoscopy: 2017     06/13/2022   11:36 AM 06/10/2021    9:16 AM  Depression screen PHQ 2/9  Decreased Interest 0 0  Down, Depressed, Hopeless 0 0  PHQ - 2 Score 0 0  Altered sleeping 0 0  Tired, decreased energy 0 0  Change in appetite 0 0  Feeling bad or failure about yourself  0 0  Trouble concentrating 0 0  Moving slowly or fidgety/restless 0 0  Suicidal thoughts 0 0  PHQ-9 Score 0 0      Review of Systems:   Pertinent items are noted in HPI Denies any headaches, blurred vision, fatigue, shortness of breath, chest pain, abdominal pain, bowel movements, urination, or intercourse unless otherwise stated above.  Pertinent History Reviewed:  Reviewed past medical,surgical, social and family history.  Reviewed problem list, medications and allergies. Physical Assessment:   Vitals:   06/13/22 1141  BP: (!) 99/56  Pulse: 72  Weight: 121 lb 9.6 oz (55.2 kg)  Height:  (1.549 m)  Body mass index is 22.98 kg/m.        Physical Examination:   General appearance - well appearing, and in no distress  Mental status - alert, oriented to person, place, and time  Psych:  She has a normal mood and affect  Skin - warm and dry, normal color, no suspicious lesions noted  Chest - effort normal, all lung fields  clear to auscultation bilaterally  Heart - normal rate and regular rhythm  Neck:  midline trachea, no thyromegaly or nodules  Breasts - breasts appear normal, no suspicious masses, no skin or nipple changes or  axillary nodes  Abdomen - soft, nontender, nondistended, no masses or organomegaly  Pelvic - VULVA: normal appearing vulva with no masses, tenderness or lesions  VAGINA: normal appearing vagina with normal color and discharge, no lesions  CERVIX: normal appearing cervix without discharge or lesions, no CMT  Thin prep pap is done with HR HPV cotesting  UTERUS: uterus is felt to be normal size, shape, consistency and nontender   ADNEXA: No adnexal masses or tenderness noted.  Extremities:  No swelling or varicosities noted  Chaperone: Faith Rogue     Assessment & Plan:  1) Well-Woman Exam Pap collected, reviewed ASCCP guidelines HSV testing  2) Easy bruising -CBC and iron panel today   Orders Placed This Encounter  Procedures   HSV 1 and 2 Ab, IgG   CBC   B12 and Folate Panel    Meds: No orders of the defined types were placed in this encounter.   Follow-up: Return in about 1 year (around 06/13/2023) for Annual.   Myna Hidalgo, DO Attending Obstetrician & Gynecologist, Faculty Practice Center for [facility], [facility]

## 2022-06-14 LAB — CBC
Hematocrit: 43.3 % (ref 34.0–46.6)
Hemoglobin: 14.5 g/dL (ref 11.1–15.9)
MCH: 31.1 pg (ref 26.6–33.0)
MCHC: 33.5 g/dL (ref 31.5–35.7)
MCV: 93 fL (ref 79–97)
Platelets: 176 10*3/uL (ref 150–450)
RBC: 4.66 x10E6/uL (ref 3.77–5.28)
RDW: 12.7 % (ref 11.7–15.4)
WBC: 6.1 10*3/uL (ref 3.4–10.8)

## 2022-06-14 LAB — B12 AND FOLATE PANEL
Folate: 19.6 ng/mL (ref >3.0)
Vitamin B-12: 1086 pg/mL (ref 232–1245)

## 2022-06-20 LAB — CYTOLOGY - PAP
Comment: NEGATIVE
Diagnosis: NEGATIVE
High risk HPV: NEGATIVE

## 2022-06-26 ENCOUNTER — Encounter: Payer: Self-pay | Admitting: Obstetrics & Gynecology

## 2022-08-05 DIAGNOSIS — Z0000 Encounter for general adult medical examination without abnormal findings: Secondary | ICD-10-CM | POA: Diagnosis not present

## 2022-08-05 DIAGNOSIS — E559 Vitamin D deficiency, unspecified: Secondary | ICD-10-CM | POA: Diagnosis not present

## 2022-08-05 DIAGNOSIS — Z20828 Contact with and (suspected) exposure to other viral communicable diseases: Secondary | ICD-10-CM | POA: Diagnosis not present

## 2022-08-05 DIAGNOSIS — Z79899 Other long term (current) drug therapy: Secondary | ICD-10-CM | POA: Diagnosis not present

## 2022-08-05 DIAGNOSIS — F419 Anxiety disorder, unspecified: Secondary | ICD-10-CM | POA: Diagnosis not present

## 2022-08-05 DIAGNOSIS — Z789 Other specified health status: Secondary | ICD-10-CM | POA: Diagnosis not present

## 2022-08-21 DIAGNOSIS — K760 Fatty (change of) liver, not elsewhere classified: Secondary | ICD-10-CM | POA: Diagnosis not present

## 2022-11-03 DIAGNOSIS — D2239 Melanocytic nevi of other parts of face: Secondary | ICD-10-CM | POA: Diagnosis not present

## 2022-11-03 DIAGNOSIS — L821 Other seborrheic keratosis: Secondary | ICD-10-CM | POA: Diagnosis not present

## 2022-11-03 DIAGNOSIS — D2271 Melanocytic nevi of right lower limb, including hip: Secondary | ICD-10-CM | POA: Diagnosis not present

## 2022-11-03 DIAGNOSIS — L814 Other melanin hyperpigmentation: Secondary | ICD-10-CM | POA: Diagnosis not present

## 2022-11-03 DIAGNOSIS — Z7189 Other specified counseling: Secondary | ICD-10-CM | POA: Diagnosis not present

## 2022-11-04 ENCOUNTER — Other Ambulatory Visit: Payer: Self-pay

## 2022-11-04 ENCOUNTER — Emergency Department (HOSPITAL_BASED_OUTPATIENT_CLINIC_OR_DEPARTMENT_OTHER)
Admission: EM | Admit: 2022-11-04 | Discharge: 2022-11-04 | Disposition: A | Discharge status: Home / Self Care | Payer: 59 | Attending: Emergency Medicine | Admitting: Emergency Medicine

## 2022-11-04 ENCOUNTER — Emergency Department (HOSPITAL_BASED_OUTPATIENT_CLINIC_OR_DEPARTMENT_OTHER): Payer: 59

## 2022-11-04 DIAGNOSIS — R9431 Abnormal electrocardiogram [ECG] [EKG]: Secondary | ICD-10-CM | POA: Diagnosis not present

## 2022-11-04 DIAGNOSIS — R0781 Pleurodynia: Secondary | ICD-10-CM | POA: Diagnosis not present

## 2022-11-04 DIAGNOSIS — Z91040 Latex allergy status: Secondary | ICD-10-CM | POA: Diagnosis not present

## 2022-11-04 DIAGNOSIS — I10 Essential (primary) hypertension: Secondary | ICD-10-CM | POA: Insufficient documentation

## 2022-11-04 DIAGNOSIS — X501XXA Overexertion from prolonged static or awkward postures, initial encounter: Secondary | ICD-10-CM | POA: Diagnosis not present

## 2022-11-04 MED ORDER — DICLOFENAC SODIUM 1 % EX GEL: 2.0000 g | Freq: Four times a day (QID) | CUTANEOUS | 0 refills | Status: DC

## 2022-11-04 MED ORDER — CYCLOBENZAPRINE HCL 10 MG PO TABS: 10.0000 mg | ORAL_TABLET | Freq: Two times a day (BID) | ORAL | 0 refills | Status: DC | PRN

## 2022-11-04 MED ORDER — ACETAMINOPHEN 325 MG PO TABS
650.0000 mg | ORAL_TABLET | Freq: Once | ORAL | Status: AC
Start: 2022-11-04 — End: 2022-11-04
  Administered 2022-11-04: 650 mg via ORAL
  Filled 2022-11-04: qty 2

## 2022-11-04 NOTE — ED Notes (Signed)
ED Provider at bedside. 
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
## 2022-11-04 NOTE — Discharge Instructions (Signed)
As discussed, CT scan did not show signs of rib fracture, collapsed lung, abnormal spleen or other abnormality.  Suspect your symptoms are likely secondary to chest wall pain on the left side.  Will recommend continue use of muscle relaxer as needed as well as topical Voltaren gel.  Continue use incentive spirometry to help decrease likelihood of developing pneumonia.  Please do not hesitate to return to emergency department for worrisome signs and symptoms we discussed become apparent.
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
## 2022-11-07 ENCOUNTER — Ambulatory Visit (INDEPENDENT_AMBULATORY_CARE_PROVIDER_SITE_OTHER): Payer: 59

## 2022-11-07 ENCOUNTER — Other Ambulatory Visit (HOSPITAL_BASED_OUTPATIENT_CLINIC_OR_DEPARTMENT_OTHER): Payer: Self-pay | Admitting: Physician Assistant

## 2022-11-07 DIAGNOSIS — R1012 Left upper quadrant pain: Secondary | ICD-10-CM

## 2022-11-07 MED ORDER — IOHEXOL 300 MG/ML  SOLN
100.0000 mL | Freq: Once | INTRAMUSCULAR | Status: AC | PRN
  Administered 2022-11-07: 100 mL via INTRAVENOUS

## 2022-12-10 DIAGNOSIS — R17 Unspecified jaundice: Secondary | ICD-10-CM | POA: Diagnosis not present

## 2022-12-10 DIAGNOSIS — R799 Abnormal finding of blood chemistry, unspecified: Secondary | ICD-10-CM | POA: Diagnosis not present

## 2022-12-10 LAB — LAB REPORT - SCANNED
EGFR: 105
HM Hepatitis Screen: NEGATIVE

## 2022-12-26 DIAGNOSIS — R0602 Shortness of breath: Secondary | ICD-10-CM | POA: Diagnosis not present

## 2022-12-26 DIAGNOSIS — R0981 Nasal congestion: Secondary | ICD-10-CM | POA: Diagnosis not present

## 2022-12-26 DIAGNOSIS — S90562A Insect bite (nonvenomous), left ankle, initial encounter: Secondary | ICD-10-CM | POA: Diagnosis not present

## 2022-12-26 DIAGNOSIS — R051 Acute cough: Secondary | ICD-10-CM | POA: Diagnosis not present

## 2023-01-07 IMAGING — DX DG CHEST 2V
2 series · 2 of 2 positions shown · non-contrast
Comparison: Chest x-ray 01/10/2021

CLINICAL DATA: Chest pain

EXAM:
CHEST - 2 VIEW

[chest pa]
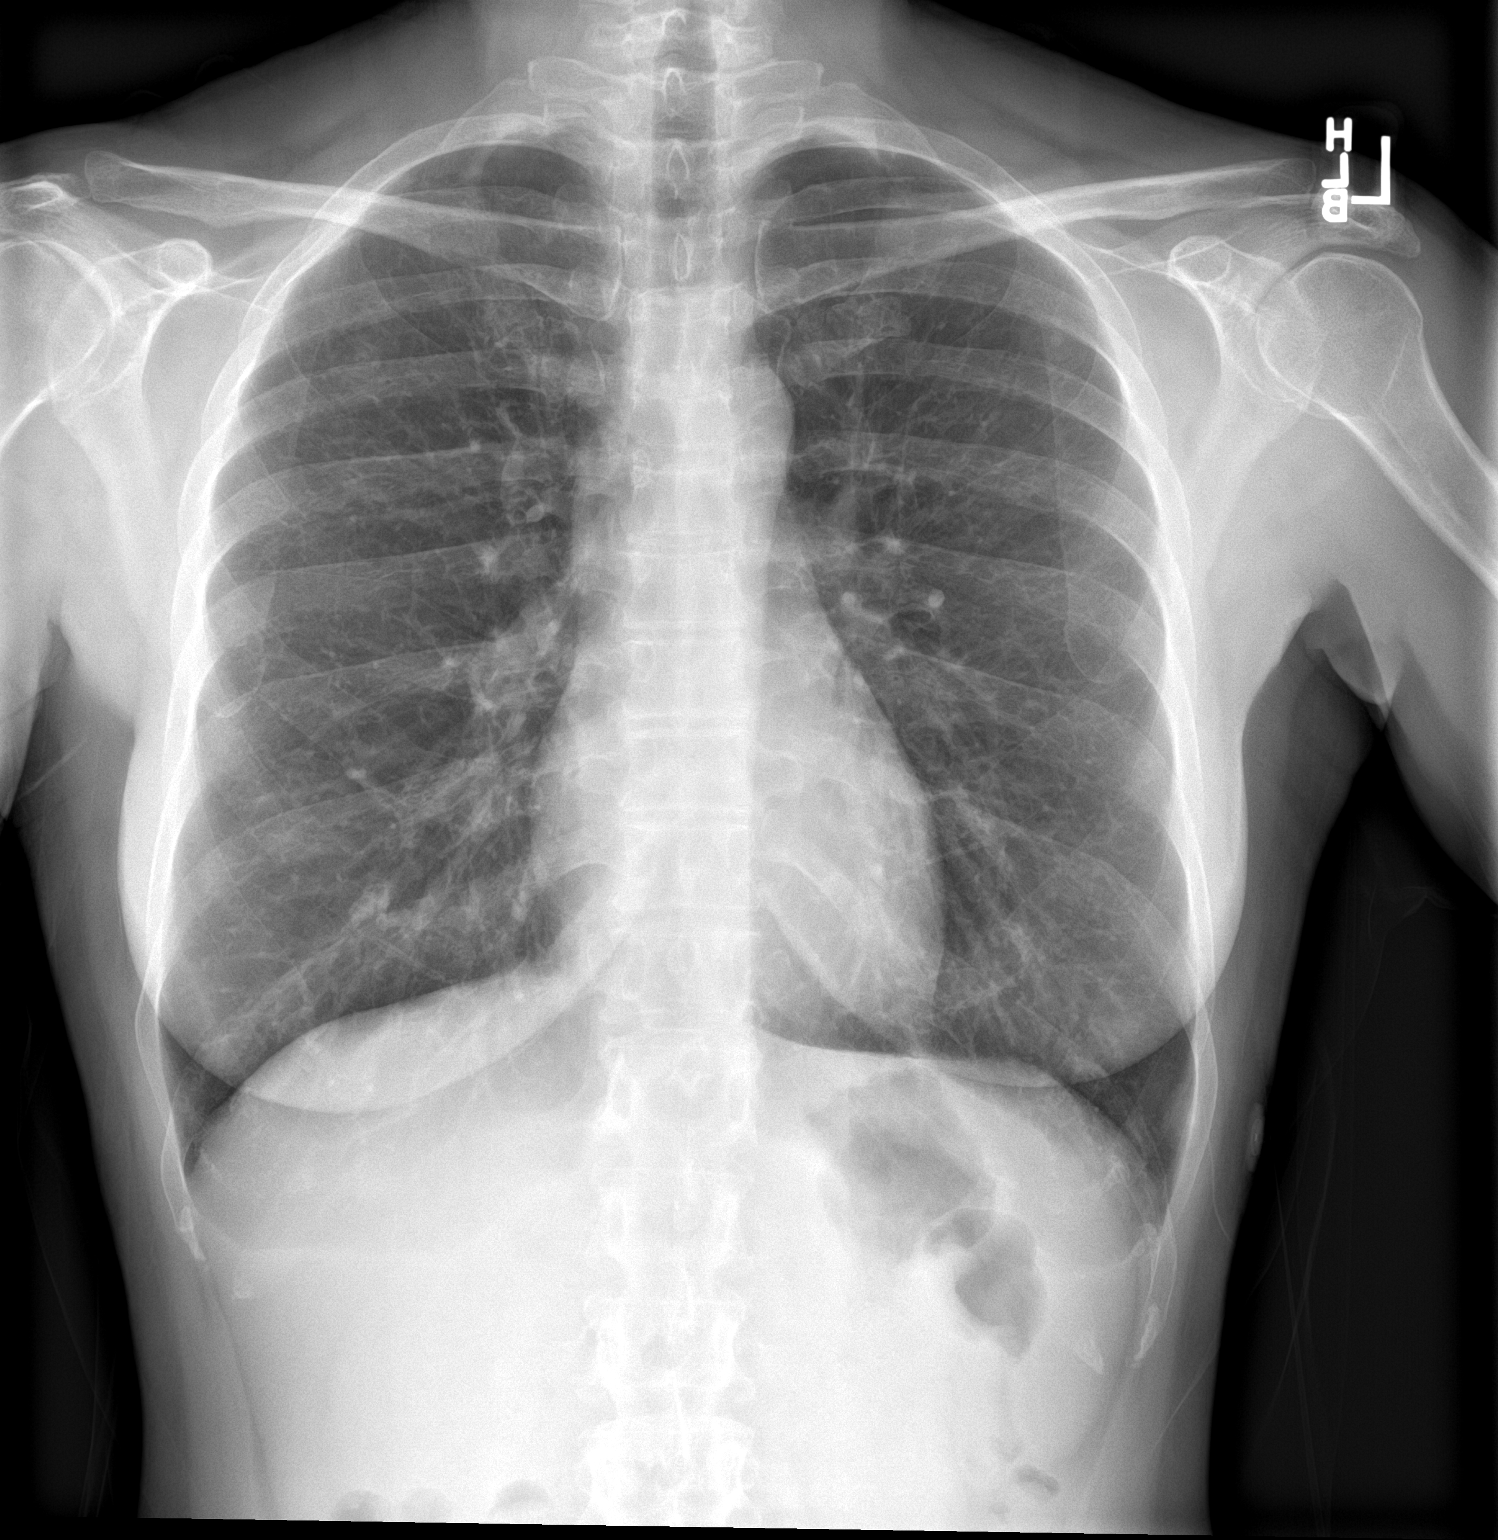

[chest lat]
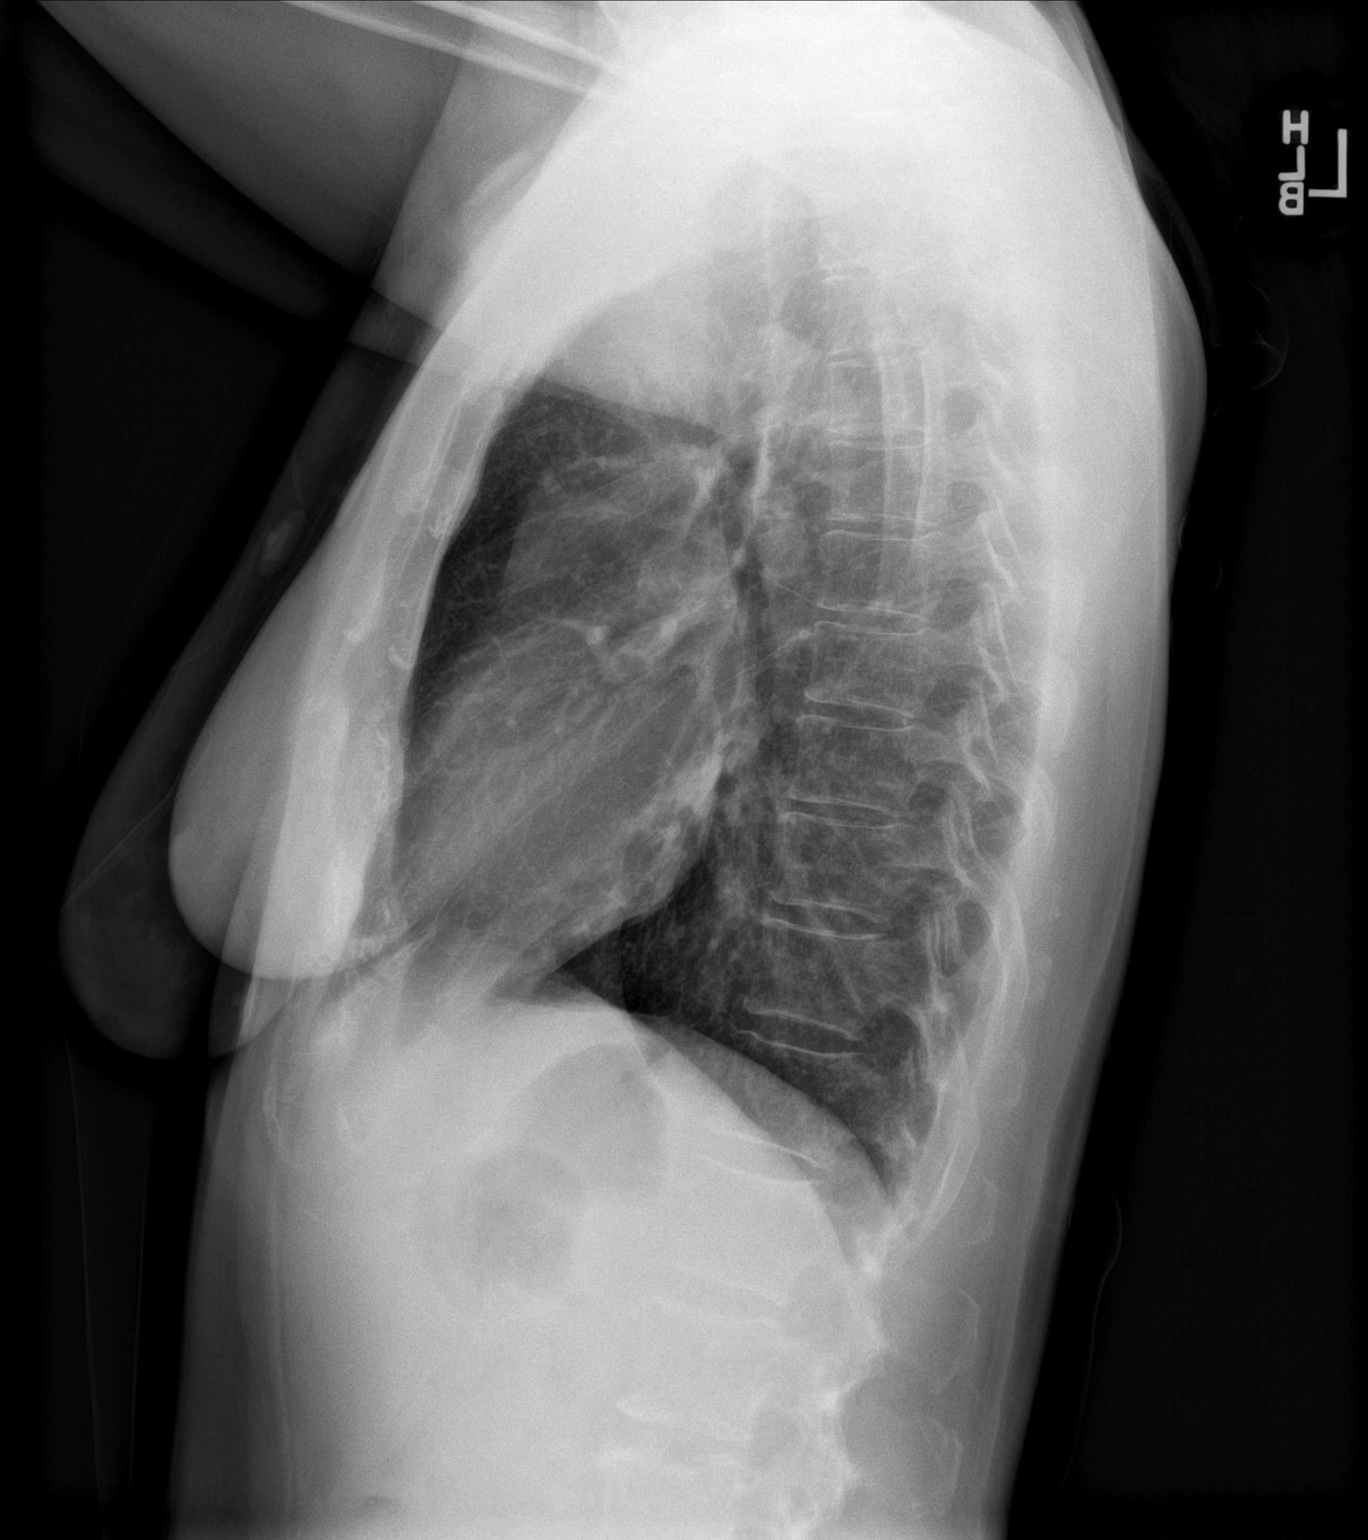

[2 of 2 positions shown; findings below may reference images not displayed]

FINDINGS: Heart size and mediastinal contours are within normal limits. No
suspicious pulmonary opacities identified.

No pleural effusion or pneumothorax visualized.

No acute osseous abnormality appreciated.
IMPRESSION: No acute intrathoracic process identified.

## 2023-03-19 ENCOUNTER — Ambulatory Visit: Payer: Self-pay

## 2023-03-19 NOTE — Telephone Encounter (Signed)
Copied from CRM [id number]. Topic: Clinical - Red Word Triage >> Mar 19, 2023  2:58 PM Jill Melendez wrote: Red Word that prompted transfer to Nurse Triage: Patient would be a new patient, she's calling because she is having a lot of drainage, headaches and inflammation on the back of the throat, swelling in thumbs and back of the legs she is also feeling a sense of tiredness and weakness.   Chief Complaint: Headaches Symptoms: Malaise, Sneezing, Post Nasal Drip Frequency: Acute Pertinent Negatives: Patient denies chest pain, dyspnea, or acute distress like symptoms.  Disposition: [] ED /[] Urgent Care (no appt availability in office) / [x] Appointment(In office/virtual)/ []  [place] [facility]/ [] Home Care/ [] Refused Recommended Disposition /[]  Mobile Bus/ []  Follow-up with PCP Additional Notes: Jill Melendez is a 53 year old female being triaged for multiple cold like symptoms after being previously treated at a local Urgent Care. The patient does not have an established PCP in [facility]. Urgent Care informed patient of a need for lab work  and a PCP appointment. aThe patient reports onset this past Wednesday, the patient reports malaise, sneezing, postnasal drip. New patient appointment made for tomorrow morning.    Reason for Disposition  [1] MODERATE weakness (i.e., interferes with work, school, normal activities) AND [2] persists > 3 days  Answer Assessment - Initial Assessment Questions 1. DESCRIPTION: "Describe how you are feeling."     Malaise  2. SEVERITY: "How bad is it?"  "Can you stand and walk?"   - MILD (0-3): Feels weak or tired, but does not interfere with work, school or normal activities.   - MODERATE (4-7): Able to stand and walk; weakness interferes with work, school, or normal activities.   - SEVERE (8-10): Unable to stand or walk; unable to do usual activities.     Moderate  3. ONSET: "When did these symptoms begin?" (e.g., hours, days, weeks, months)     Wednesday  4.  CAUSE: "What do you think is causing the weakness or fatigue?" (e.g., not drinking enough fluids, medical problem, trouble sleeping)     Infection  5. NEW MEDICINES:  "Have you started on any new medicines recently?" (e.g., opioid pain medicines, benzodiazepines, muscle relaxants, antidepressants, antihistamines, neuroleptics, beta blockers)     Amoxicillin  6. OTHER SYMPTOMS: "Do you have any other symptoms?" (e.g., chest pain, fever, cough, SOB, vomiting, diarrhea, bleeding, other areas of pain)     Sneezing, Body Aches, Headache  7. PREGNANCY: "Is there any chance you are pregnant?" "When was your last menstrual period?"     No  Protocols used: Weakness (Generalized) and Fatigue-A-AH

## 2023-03-20 ENCOUNTER — Ambulatory Visit: Payer: Self-pay | Admitting: Family Medicine

## 2023-03-20 ENCOUNTER — Ambulatory Visit (INDEPENDENT_AMBULATORY_CARE_PROVIDER_SITE_OTHER): Payer: No Typology Code available for payment source | Admitting: Physician Assistant

## 2023-03-20 ENCOUNTER — Encounter: Payer: Self-pay | Admitting: Physician Assistant

## 2023-03-20 VITALS — BP 128/83 | HR 82 | Temp 97.7°F | Ht 60.0 in | Wt 125.1 lb

## 2023-03-20 DIAGNOSIS — K760 Fatty (change of) liver, not elsewhere classified: Secondary | ICD-10-CM | POA: Insufficient documentation

## 2023-03-20 DIAGNOSIS — J069 Acute upper respiratory infection, unspecified: Secondary | ICD-10-CM

## 2023-03-20 NOTE — Progress Notes (Signed)
New patient visit   Patient: Jill Melendez   DOB: Feb 19, 1971   53 y.o. Female  MRN: [id number] Visit Date: 03/20/2023  Today's healthcare provider: Alfredia Ferguson, PA-C   Cc. New patient, post nasal drip. NASH  Subjective    Jill Melendez is a 53 y.o. female who presents today as a new patient to establish care.   Discussed the use of AI scribe software for clinical note transcription with the patient, who gave verbal consent to proceed.  History of Present Illness   The patient, with a history of fatty liver disease, presents with recent upper respiratory symptoms and fatigue. The patient reports feeling unwell since last week, with symptoms including post-nasal drip, malaise, and lack of energy. The patient denies having a cough but reports a sensation of congestion in the throat. The patient also reports a history of thumb pain and swelling, which she associates with viral infections. The patient's energy levels have been significantly affected, limiting her daily activities. She was seen at urgent care earlier this week, and prescribed amoxicillin and given a steroid shot. She was also sent in a steroid prescription but has not picked it up yet.  In addition to the recent symptoms, the patient expresses concern about her liver health. She has a history of fatty liver disease and has experienced episodes of liver spasms and pain. The patient reports that her bilirubin has been consistently elevated, causing her concern.       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   Influenza due to influenza virus, type B 04/12/2014   Migraine    Sepsis (HCC) 04/11/2014   Past Surgical History:  Procedure Laterality Date   BREAST EXCISIONAL BIOPSY Left 1994   BREAST SURGERY     COLONOSCOPY     SIGMOIDOSCOPY     some upper Gi test     pt states was not EGD    Family Status  Relation Name Status   PGF  Deceased   PGM Jasneet Deceased   MGM [place] Alive   MGF Rosanne Ashing Deceased   Father Dorene Sorrow Alive   Mother Vickie Alive   Sister half Alive   Pat Aunt  Deceased   Pat Aunt Becky Alive   Neg Hx  (Not Specified)  No partnership data on file   Family History  Problem Relation Age of Onset   Colon cancer Paternal Grandfather 78   Heart disease Paternal Grandmother    Early death Paternal Grandmother    Heart disease Maternal Grandmother    Varicose Veins Maternal Grandmother    Cancer Maternal Grandfather    Kidney disease Maternal Grandfather    COPD Maternal Grandfather    Cancer Father        bladder; prostate   COPD Father    Hearing loss Father    Kidney disease Father    High blood pressure Mother    Stroke Mother    Colon polyps Mother    Heart disease Mother    Hypertension Mother    Vision loss Mother    Breast cancer Paternal Aunt    Cancer Paternal Aunt    Esophageal cancer Neg Hx    Rectal cancer Neg Hx    Stomach cancer Neg Hx    Social History   Socioeconomic History   Marital status: Single  Spouse name: Loraine Leriche    Number of children: 0   Years of education: GED   Highest education level: GED or equivalent  Occupational History   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   Self-employed esthetician nail tech   Caffeine use: none. Stopped drinking any caffeine in 2014.    Social Drivers of Corporate investment banker Strain: Low  Risk  (03/19/2023)   Overall Financial Resource Strain (CARDIA)    Difficulty of Paying Living Expenses: Not hard at all  Food Insecurity: No Food Insecurity (03/19/2023)   Hunger Vital Sign    Worried About Running Out of Food in the Last Year: Never true    Ran Out of Food in the Last Year: Never true  Transportation Needs: No Transportation Needs (03/19/2023)   PRAPARE - Administrator, Civil Service (Medical): No    Lack of Transportation (Non-Medical): No  Physical Activity: Sufficiently Active (03/19/2023)   Exercise Vital Sign    Days of Exercise per Week: 5 days    Minutes of Exercise per Session: 30 min  Stress: No Stress Concern Present (03/19/2023)   [employer] of Occupational Health - Occupational Stress Questionnaire    Feeling of Stress : Not at all  Social Connections: Moderately Integrated (03/19/2023)   Social Connection and Isolation Panel [NHANES]    Frequency of Communication with Friends and Family: More than three times a week    Frequency of Social Gatherings with Friends and Family: Once a week    Attends Religious Services: 1 to 4 times per year    Active Member of [employer] or Organizations: No    Attends Banker Meetings: Never    Marital Status: Married   Outpatient Medications Prior to Visit  Medication Sig   amoxicillin (AMOXIL) 500 MG capsule Take 500 mg by mouth 3 (three) times daily.   predniSONE (STERAPRED UNI-PAK 21 TAB) 10 MG (21) TBPK tablet Take 10 mg by mouth daily.   cyclobenzaprine (FLEXERIL) 10 MG tablet Take 10 mg by mouth as needed for muscle spasms.   [DISCONTINUED] cholecalciferol (VITAMIN D) 1000 units tablet Take 1,000 Units by mouth daily.   [DISCONTINUED] Cyanocobalamin (B12 LIQUID HEALTH BOOSTER PO) Take by mouth.   [DISCONTINUED] cyclobenzaprine (FLEXERIL) 10 MG tablet Take 1 tablet (10 mg total) by mouth 2 (two) times daily as needed for muscle spasms.   [DISCONTINUED] diclofenac Sodium (VOLTAREN) 1 % GEL  Apply 2 g topically 4 (four) times daily.   [DISCONTINUED] Fenugreek 500 MG CAPS Take by mouth.   [DISCONTINUED] LORazepam (ATIVAN) 1 MG tablet Take 1 mg by mouth daily as needed for anxiety. Reported on 09/10/2015 (Patient not taking: Reported on 06/13/2022)   [DISCONTINUED] Lysine 500 MG CAPS Take by mouth.   [DISCONTINUED] magnesium 30 MG tablet Take 30 mg by mouth 2 (two) times daily.   [DISCONTINUED] thiamine (VITAMIN B-1) 100 MG tablet Take 100 mg  by mouth daily.   [DISCONTINUED] UNABLE TO FIND Horsetail-2 daily; Sam-e-daily; Niacinamide-daily; Vit B12-daily; L-Lysine-daily; Milk Thistle Dandelion-daily; Pau D'Arco-daily   [DISCONTINUED] vitamin E 100 UNIT capsule Take 100 Units by mouth daily.   No facility-administered medications prior to visit.   Allergies  Allergen Reactions   Other Anaphylaxis    [place] veg-joint pain   Advair Diskus [Fluticasone-Salmeterol] Other (See Comments)    Visual changes    Bee Venom Swelling   Buspirone Other (See Comments)    Severe headache   Codeine Itching and Nausea And Vomiting   Fluoxetine Other (See Comments)    Felt weird   Ibuprofen Other (See Comments)    Feels like she is sedated   Nsaids Other (See Comments)    Feels sedated   Sertraline Hcl Other (See Comments)    Couldn't sleep; OCD   Latex Rash    Blisters      There is no immunization history on file for this patient.  Health Maintenance  Topic Date Due   Hepatitis C Screening  Never done   DTaP/Tdap/Td (1 - Tdap) Never done   Zoster Vaccines- Shingrix (1 of 2) Never done   COVID-19 Vaccine (1) 04/05/2023 (Originally 12/29/1975)   INFLUENZA VACCINE  05/25/2023 (Originally 09/25/2022)   MAMMOGRAM  02/08/2024   Colonoscopy  09/09/2025   Cervical Cancer Screening (HPV/Pap Cotest)  06/13/2027   HIV Screening  Completed   HPV VACCINES  Aged Out    Patient Care Team: Alfredia Ferguson, PA-C as PCP - General (Physician Assistant) Marden Noble, MD (Inactive)  (Internal Medicine)  Review of Systems  Constitutional:  Positive for fatigue. Negative for fever.  HENT:  Positive for congestion and postnasal drip.   Respiratory:  Negative for cough and shortness of breath.   Cardiovascular:  Negative for chest pain and leg swelling.  Gastrointestinal:  Negative for abdominal pain.  Musculoskeletal:  Positive for arthralgias.  Neurological:  Negative for dizziness and headaches.        Objective    BP 128/83   Pulse 82   Temp 97.7 F (36.5 C) (Oral)   Ht 5' (1.524 m)   Wt 125 lb 2 oz (56.8 kg)   SpO2 100%   BMI 24.44 kg/m     Physical Exam Constitutional:      General: She is awake.     Appearance: She is well-developed.  HENT:     Head: Normocephalic.     Right Ear: Tympanic membrane normal.     Left Ear: Tympanic membrane normal.     Mouth/Throat:     Pharynx: Posterior oropharyngeal erythema present. No oropharyngeal exudate.  Eyes:     Conjunctiva/sclera: Conjunctivae normal.  Cardiovascular:     Rate and Rhythm: Normal rate and regular rhythm.     Heart sounds: Normal heart sounds.  Pulmonary:     Effort: Pulmonary effort is normal.     Breath sounds: Normal breath sounds. No wheezing, rhonchi or rales.  Skin:    General: Skin is warm.  Neurological:     Mental Status: She is alert and oriented to person, place, and time.  Psychiatric:        Attention and Perception: Attention normal.        Mood and Affect: Mood normal.        Speech: Speech normal.        Behavior: Behavior is cooperative.     Depression Screen    03/20/2023    1:49 PM  06/13/2022   11:36 AM 06/10/2021    9:16 AM  PHQ 2/9 Scores  PHQ - 2 Score 0 0 0  PHQ- 9 Score  0 0   No results found for any visits on 03/20/23.  Assessment & Plan     Upper respiratory tract infection, unspecified type Cont abx, recommending zyrtec/flonase/azelastine otc.  Non-alcoholic fatty liver disease Assessment & Plan: Recommending repeat testing q 6 mo  after last liver testing-- 10/24.  Would repeat US and cmp/bilirubin studies  Orders: -     US ABDOMEN LIMITED RUQ (LIVER/GB); Future -     Comprehensive metabolic panel; Future -     CBC with Differential/Platelet; Future -     Bilirubin, fractionated(tot/dir/indir); Future  Return if symptoms worsen or fail to improve.      Alfredia Ferguson, PA-C  [facility] at [facility] [phone] (phone) [phone] (fax)  [facility]

## 2023-03-20 NOTE — Assessment & Plan Note (Signed)
Recommending repeat testing q 6 mo after last liver testing-- 10/24.  Would repeat US and cmp/bilirubin studies

## 2023-05-06 ENCOUNTER — Encounter (INDEPENDENT_AMBULATORY_CARE_PROVIDER_SITE_OTHER): Payer: Self-pay | Admitting: Physician Assistant

## 2023-05-06 DIAGNOSIS — B370 Candidal stomatitis: Secondary | ICD-10-CM | POA: Diagnosis not present

## 2023-05-06 MED ORDER — FLUCONAZOLE 150 MG PO TABS: 150.0000 mg | ORAL_TABLET | Freq: Once | ORAL | 0 refills | Status: AC

## 2023-05-06 NOTE — Telephone Encounter (Signed)
 Please see the MyChart message reply(ies) for my assessment and plan.    This patient gave consent for this Medical Advice Message and is aware that it may result in a bill to [employer], as well as the possibility of receiving a bill for a co-payment or deductible. They are an established patient, but are not seeking medical advice exclusively about a problem treated during an in person or video visit in the last seven days. I did not recommend an in person or video visit within seven days of my reply.    I spent a total of 5 minutes cumulative time within 7 days through [employer].  Alfredia Ferguson, PA-C

## 2023-06-02 ENCOUNTER — Encounter: Payer: Self-pay | Admitting: Physician Assistant

## 2023-06-03 ENCOUNTER — Other Ambulatory Visit: Payer: Self-pay | Admitting: Physician Assistant

## 2023-06-03 ENCOUNTER — Encounter: Payer: Self-pay | Admitting: Physician Assistant

## 2023-06-03 DIAGNOSIS — Z13220 Encounter for screening for lipoid disorders: Secondary | ICD-10-CM

## 2023-06-03 DIAGNOSIS — J302 Other seasonal allergic rhinitis: Secondary | ICD-10-CM

## 2023-06-03 DIAGNOSIS — Z8639 Personal history of other endocrine, nutritional and metabolic disease: Secondary | ICD-10-CM

## 2023-06-03 NOTE — Telephone Encounter (Signed)
 Responded on other message

## 2023-06-04 ENCOUNTER — Encounter: Payer: Self-pay | Admitting: Physician Assistant

## 2023-06-04 ENCOUNTER — Ambulatory Visit (INDEPENDENT_AMBULATORY_CARE_PROVIDER_SITE_OTHER): Admitting: Physician Assistant

## 2023-06-04 ENCOUNTER — Other Ambulatory Visit: Payer: Self-pay | Admitting: Physician Assistant

## 2023-06-04 ENCOUNTER — Other Ambulatory Visit (INDEPENDENT_AMBULATORY_CARE_PROVIDER_SITE_OTHER)

## 2023-06-04 VITALS — BP 115/75 | HR 69 | Ht 60.0 in | Wt 124.0 lb

## 2023-06-04 DIAGNOSIS — M79644 Pain in right finger(s): Secondary | ICD-10-CM | POA: Diagnosis not present

## 2023-06-04 DIAGNOSIS — G8929 Other chronic pain: Secondary | ICD-10-CM | POA: Diagnosis not present

## 2023-06-04 DIAGNOSIS — R519 Headache, unspecified: Secondary | ICD-10-CM

## 2023-06-04 DIAGNOSIS — Z13220 Encounter for screening for lipoid disorders: Secondary | ICD-10-CM | POA: Diagnosis not present

## 2023-06-04 DIAGNOSIS — R1013 Epigastric pain: Secondary | ICD-10-CM | POA: Diagnosis not present

## 2023-06-04 DIAGNOSIS — Z13820 Encounter for screening for osteoporosis: Secondary | ICD-10-CM

## 2023-06-04 DIAGNOSIS — Z8639 Personal history of other endocrine, nutritional and metabolic disease: Secondary | ICD-10-CM

## 2023-06-04 DIAGNOSIS — K760 Fatty (change of) liver, not elsewhere classified: Secondary | ICD-10-CM | POA: Diagnosis not present

## 2023-06-04 LAB — COMPREHENSIVE METABOLIC PANEL WITH GFR
ALT: 17 U/L (ref 0–35)
AST: 20 U/L (ref 0–37)
Albumin: 4.7 g/dL (ref 3.5–5.2)
Alkaline Phosphatase: 73 U/L (ref 39–117)
BUN: 8 mg/dL (ref 6–23)
CO2: 31 meq/L (ref 19–32)
Calcium: 9.7 mg/dL (ref 8.4–10.5)
Chloride: 103 meq/L (ref 96–112)
Creatinine, Ser: 0.71 mg/dL (ref 0.40–1.20)
GFR: 98.44 mL/min (ref >60.00)
Glucose, Bld: 90 mg/dL (ref 70–99)
Potassium: 3.5 meq/L (ref 3.5–5.1)
Sodium: 141 meq/L (ref 135–145)
Total Bilirubin: 1.6 mg/dL — ABNORMAL HIGH (ref 0.2–1.2)
Total Protein: 7 g/dL (ref 6.0–8.3)

## 2023-06-04 LAB — LIPID PANEL
Cholesterol: 159 mg/dL (ref 0–200)
HDL: 62.4 mg/dL (ref >39.00)
LDL Cholesterol: 82 mg/dL (ref 0–99)
NonHDL: 96.36
Total CHOL/HDL Ratio: 3
Triglycerides: 74 mg/dL (ref 0.0–149.0)
VLDL: 14.8 mg/dL (ref 0.0–40.0)

## 2023-06-04 LAB — CBC WITH DIFFERENTIAL/PLATELET
Basophils Absolute: 0 10*3/uL (ref 0.0–0.1)
Basophils Relative: 0.7 % (ref 0.0–3.0)
Eosinophils Absolute: 0 10*3/uL (ref 0.0–0.7)
Eosinophils Relative: 0.9 % (ref 0.0–5.0)
HCT: 41.2 % (ref 36.0–46.0)
Hemoglobin: 14.2 g/dL (ref 12.0–15.0)
Lymphocytes Relative: 33.6 % (ref 12.0–46.0)
Lymphs Abs: 1.2 10*3/uL (ref 0.7–4.0)
MCHC: 34.4 g/dL (ref 30.0–36.0)
MCV: 90.5 fl (ref 78.0–100.0)
Monocytes Absolute: 0.3 10*3/uL (ref 0.1–1.0)
Monocytes Relative: 7.8 % (ref 3.0–12.0)
Neutro Abs: 2.1 10*3/uL (ref 1.4–7.7)
Neutrophils Relative %: 57 % (ref 43.0–77.0)
Platelets: 148 10*3/uL — ABNORMAL LOW (ref 150.0–400.0)
RBC: 4.55 Mil/uL (ref 3.87–5.11)
RDW: 12.6 % (ref 11.5–15.5)
WBC: 3.6 10*3/uL — ABNORMAL LOW (ref 4.0–10.5)

## 2023-06-04 LAB — IBC + FERRITIN
Ferritin: 50.9 ng/mL (ref 10.0–291.0)
Iron: 110 ug/dL (ref 42–145)
Saturation Ratios: 28.4 % (ref 20.0–50.0)
TIBC: 387.8 ug/dL (ref 250.0–450.0)
Transferrin: 277 mg/dL (ref 212.0–360.0)

## 2023-06-04 LAB — VITAMIN D 25 HYDROXY (VIT D DEFICIENCY, FRACTURES): VITD: 41.39 ng/mL (ref 30.00–100.00)

## 2023-06-04 LAB — VITAMIN B12: Vitamin B-12: 381 pg/mL (ref 211–911)

## 2023-06-04 MED ORDER — CELECOXIB 100 MG PO CAPS: 100.0000 mg | ORAL_CAPSULE | Freq: Two times a day (BID) | ORAL | 1 refills | Status: DC

## 2023-06-04 NOTE — Progress Notes (Signed)
 Established patient visit   Patient: Jill Melendez   DOB: 1970/03/20   53 y.o. Female  MRN: [id number] Visit Date: 06/04/2023  Today's healthcare provider: Alfredia Ferguson, PA-C   Cc. Several concerns  Subjective    Discussed the use of AI scribe software for clinical note transcription with the patient, who gave verbal consent to proceed.  History of Present Illness   The patient presents with multiple complaints. The chief complaint is a right thumb that won't bend, described as a "trigger thumb". The patient reports pain and swelling in the thumb, and a feeling of a cyst in the joint. The patient has had two injections in the past for this issue, but the problem has recurred after a year.   The patient also reports a burning sensation in the stomach, especially in the mornings, which has been ongoing for the past six months. The patient has a history of H Pylori infection and stomach ulcers, and is concerned about a possible recurrence. Denies taking any antacids. She is fasting.  The patient also reports a stabbing pain in the head, which has been occurring intermittently. The pain is described as intense and is located above a spot on the scalp where the patient has a lump.      Medications: Outpatient Medications Prior to Visit  Medication Sig   cyclobenzaprine (FLEXERIL) 10 MG tablet Take 10 mg by mouth as needed for muscle spasms.   No facility-administered medications prior to visit.    Review of Systems  Constitutional:  Negative for fatigue and fever.  Respiratory:  Negative for cough and shortness of breath.   Cardiovascular:  Negative for chest pain and leg swelling.  Gastrointestinal:  Positive for abdominal pain.  Musculoskeletal:  Positive for arthralgias and joint swelling.  Neurological:  Positive for headaches. Negative for dizziness.       Objective    BP 115/75   Pulse 69   Ht 5' (1.524 m)   Wt 124 lb (56.2 kg)   BMI 24.22 kg/m    Physical  Exam Vitals reviewed.  Constitutional:      Appearance: She is not ill-appearing.  HENT:     Head: Normocephalic.  Eyes:     Conjunctiva/sclera: Conjunctivae normal.  Cardiovascular:     Rate and Rhythm: Normal rate.  Pulmonary:     Effort: Pulmonary effort is normal. No respiratory distress.  Abdominal:     Palpations: Abdomen is soft.     Tenderness: There is no abdominal tenderness.  Musculoskeletal:     Comments: Right thumb MCP is tender to palpation  Limited ROM  Skin:    Comments: Small nevi right upper scalp  Neurological:     Mental Status: She is alert and oriented to person, place, and time.  Psychiatric:        Mood and Affect: Mood normal.        Behavior: Behavior normal.     No results found for any visits on 06/04/23.  Assessment & Plan    Chronic pain of right thumb - Prescribe celecoxib for flare-ups, to be taken twice daily for up to five days as needed. Pt has history of poor reaction to otc NSAIDs, cautioned, pt willing to try. - Consider referral to orthopedics for possible steroid injection if celecoxib is ineffective. - Refer to occupational therapy for hand strengthening and massage techniques.  -     Celecoxib; Take 1 capsule (100 mg total) by mouth 2 (two) times  daily.  Dispense: 30 capsule; Refill: 1 -     Ambulatory referral to Occupational Therapy  Epigastric pain - Order H. Pylori breath test. - If H. Pylori test is positive, discuss treatment options for H. Pylori infection. - If H. Pylori test is negative, prescribe daily PPI for 6-8 weeks. -     H. pylori breath test  Headache - Consider referral to a neurologist if symptoms persist. - Encourage hydration and monitor for positional triggers. - Consider acupuncture for symptom relief.    Return if symptoms worsen or fail to improve.       Alfredia Ferguson, PA-C  [facility] [phone] (phone) [phone] (fax)  [facility]

## 2023-06-04 NOTE — Addendum Note (Signed)
 Addended by: Kathi Simpers on: 06/04/2023 09:38 AM   Modules accepted: Orders

## 2023-06-05 ENCOUNTER — Encounter: Payer: Self-pay | Admitting: Physician Assistant

## 2023-06-05 ENCOUNTER — Other Ambulatory Visit: Payer: Self-pay | Admitting: Physician Assistant

## 2023-06-05 ENCOUNTER — Ambulatory Visit (HOSPITAL_BASED_OUTPATIENT_CLINIC_OR_DEPARTMENT_OTHER)
Admission: RE | Admit: 2023-06-05 | Discharge: 2023-06-05 | Disposition: A | Discharge status: Home / Self Care | Payer: No Typology Code available for payment source | Source: Ambulatory Visit | Attending: Physician Assistant | Admitting: Physician Assistant

## 2023-06-05 ENCOUNTER — Other Ambulatory Visit: Payer: No Typology Code available for payment source

## 2023-06-05 DIAGNOSIS — K760 Fatty (change of) liver, not elsewhere classified: Secondary | ICD-10-CM | POA: Diagnosis present

## 2023-06-05 DIAGNOSIS — D696 Thrombocytopenia, unspecified: Secondary | ICD-10-CM

## 2023-06-05 DIAGNOSIS — K219 Gastro-esophageal reflux disease without esophagitis: Secondary | ICD-10-CM

## 2023-06-05 LAB — H. PYLORI BREATH TEST: H. pylori Breath Test: NOT DETECTED

## 2023-06-05 LAB — BILIRUBIN, FRACTIONATED(TOT/DIR/INDIR)
Bilirubin, Direct: 0.3 mg/dL — ABNORMAL HIGH (ref 0.0–0.2)
Indirect Bilirubin: 1 mg/dL (ref 0.2–1.2)
Total Bilirubin: 1.3 mg/dL — ABNORMAL HIGH (ref 0.2–1.2)

## 2023-06-05 MED ORDER — OMEPRAZOLE 40 MG PO CPDR
40.0000 mg | DELAYED_RELEASE_CAPSULE | Freq: Every day | ORAL | 0 refills | Status: DC
Start: 2023-06-05 — End: 2023-06-22

## 2023-06-08 ENCOUNTER — Encounter: Payer: Self-pay | Admitting: Physician Assistant

## 2023-06-09 ENCOUNTER — Other Ambulatory Visit: Payer: Self-pay | Admitting: Physician Assistant

## 2023-06-09 ENCOUNTER — Encounter: Payer: Self-pay | Admitting: Physician Assistant

## 2023-06-09 DIAGNOSIS — K839 Disease of biliary tract, unspecified: Secondary | ICD-10-CM

## 2023-06-09 DIAGNOSIS — K760 Fatty (change of) liver, not elsewhere classified: Secondary | ICD-10-CM

## 2023-06-09 NOTE — Telephone Encounter (Signed)
 Responded on other message

## 2023-06-15 ENCOUNTER — Encounter: Payer: Self-pay | Admitting: Obstetrics & Gynecology

## 2023-06-15 ENCOUNTER — Ambulatory Visit: Admitting: Obstetrics & Gynecology

## 2023-06-15 VITALS — BP 101/68 | HR 62 | Ht 61.0 in | Wt 124.8 lb

## 2023-06-15 DIAGNOSIS — Z01411 Encounter for gynecological examination (general) (routine) with abnormal findings: Secondary | ICD-10-CM | POA: Diagnosis not present

## 2023-06-15 DIAGNOSIS — N952 Postmenopausal atrophic vaginitis: Secondary | ICD-10-CM

## 2023-06-15 DIAGNOSIS — N941 Unspecified dyspareunia: Secondary | ICD-10-CM | POA: Diagnosis not present

## 2023-06-15 DIAGNOSIS — R6882 Decreased libido: Secondary | ICD-10-CM | POA: Diagnosis not present

## 2023-06-15 MED ORDER — ESTRADIOL 0.1 MG/GM VA CREA: TOPICAL_CREAM | VAGINAL | 3 refills | Status: DC

## 2023-06-15 NOTE — Progress Notes (Signed)
 WELL-WOMAN EXAMINATION Patient name: Jill Melendez MRN [id number]  Date of birth: 20-Nov-1970 Chief Complaint:   Gynecologic Exam  History of Present Illness:   Jill Melendez is a 53 y.o. G1P0010 PM female being seen today for a routine well-woman exam.   Her only concern is decreased libido, she notes she just never feels like she is in the mood.  Once her partner initiates, sex is enjoyable.  She has noted some vaginal discomfort and dyspareunia.  She is using coconut oil regularly, which does seem to help.  Also notes that at times with orgasm following she will have considerable excruciating headache.  Notes she previously had history of headaches back in 2015 and CT and MRI of brain were performed.  No acute abnormalities were noted at that time  Denies vaginal bleeding, discharge, itching or irritation.  Denies pelvic or abdominal pain.  With estrogen supplements like soy, she will have breast pain.  With sweet potato- pelvic cramping.   Patient's last menstrual period was 09/24/2021 (approximate). The current method of family planning is post menopausal status.    Last pap 05/2022.  Last mammogram: 04/2023. Last colonoscopy: 2017     06/15/2023   11:44 AM 03/20/2023    1:49 PM 06/13/2022   11:36 AM 06/10/2021    9:16 AM  Depression screen PHQ 2/9  Decreased Interest 0 0 0 0  Down, Depressed, Hopeless 0 0 0 0  PHQ - 2 Score 0 0 0 0  Altered sleeping 0  0 0  Tired, decreased energy 0  0 0  Change in appetite 0  0 0  Feeling bad or failure about yourself  0  0 0  Trouble concentrating 0  0 0  Moving slowly or fidgety/restless 0  0 0  Suicidal thoughts 0  0 0  PHQ-9 Score 0  0 0      Review of Systems:   Pertinent items are noted in HPI Denies any headaches, blurred vision, fatigue, shortness of breath, chest pain, abdominal pain, bowel movements, urination, or intercourse unless otherwise stated above.  Pertinent History Reviewed:  Reviewed past medical,surgical,  social and family history.  Reviewed problem list, medications and allergies. Physical Assessment:   Vitals:   06/15/23 1145  BP: 101/68  Pulse: 62  Weight: 124 lb 12.8 oz (56.6 kg)  Height: 5\' 1"  (1.549 m)  Body mass index is 23.58 kg/m.        Physical Examination:   General appearance - well appearing, and in no distress  Mental status - alert, oriented to person, place, and time  Psych:  She has a normal mood and affect  Skin - warm and dry, normal color, no suspicious lesions noted  Chest - effort normal, all lung fields clear to auscultation bilaterally  Heart - normal rate and regular rhythm  Neck:  midline trachea, no thyromegaly or nodules  Breasts - breasts appear normal, no suspicious masses, no skin or nipple changes or  axillary nodes  Abdomen - soft, nontender, nondistended, no masses or organomegaly  Pelvic - VULVA: normal appearing vulva with no masses, tenderness or lesions  VAGINA: Around introitus and specifically urethra hyperpigmentation of mucosa noted as well as flattening of normal rugae, no discrete lesion noted CERVIX: normal appearing cervix without discharge or lesions, no CMT  UTERUS: uterus is felt to be normal size, shape, consistency and nontender   ADNEXA: No adnexal masses or tenderness noted.  Extremities:  No swelling or varicosities noted  Chaperone:  pt declined      Assessment & Plan:  1) Well-Woman Exam -pap up to date, reviewed screening guidelines  2) Decreased libido - Discussed limited options including Addyi, testosterone gel as well as supplements - Patient plans to do a little research on her own before starting something as she is currently in the process of workup regarding her liver  3) Dyspareunia/vaginal atrophy - Continue coconut use - Discussed vaginal estrogen therapy.  Reviewed risk benefits and discussed WHI study - Rx sent in for pt to consider  Meds ordered this encounter  Medications   estradiol  (ESTRACE   VAGINAL) 0.1 MG/GM vaginal cream    Sig: 0.5g (pea-sized amount) twice per week    Dispense:  42.5 g    Refill:  3     Meds:  Meds ordered this encounter  Medications   estradiol  (ESTRACE  VAGINAL) 0.1 MG/GM vaginal cream    Sig: 0.5g (pea-sized amount) twice per week    Dispense:  42.5 g    Refill:  3    Follow-up: Return in about 1 year (around 06/14/2024) for Annual.   Doniqua Saxby, DO Attending Obstetrician & Gynecologist, [facility] for [facility], [facility]

## 2023-06-17 ENCOUNTER — Other Ambulatory Visit: Payer: Self-pay

## 2023-06-17 ENCOUNTER — Ambulatory Visit: Attending: Physician Assistant | Admitting: Occupational Therapy

## 2023-06-17 DIAGNOSIS — M79644 Pain in right finger(s): Secondary | ICD-10-CM | POA: Insufficient documentation

## 2023-06-17 DIAGNOSIS — M6281 Muscle weakness (generalized): Secondary | ICD-10-CM | POA: Insufficient documentation

## 2023-06-17 DIAGNOSIS — G8929 Other chronic pain: Secondary | ICD-10-CM | POA: Diagnosis not present

## 2023-06-17 DIAGNOSIS — R278 Other lack of coordination: Secondary | ICD-10-CM | POA: Diagnosis present

## 2023-06-17 DIAGNOSIS — R29898 Other symptoms and signs involving the musculoskeletal system: Secondary | ICD-10-CM | POA: Diagnosis present

## 2023-06-17 DIAGNOSIS — R208 Other disturbances of skin sensation: Secondary | ICD-10-CM | POA: Insufficient documentation

## 2023-06-17 NOTE — Therapy (Unsigned)
 OUTPATIENT OCCUPATIONAL THERAPY ORTHO EVALUATION  Patient Name: Jill Melendez MRN: [id number] DOB:1971/02/15, 53 y.o., female Today's Date: 06/18/2023  PCP: Trenton Frock, PA-C  REFERRING PROVIDER: Trenton Frock, PA-C   END OF SESSION:  OT End of Session - 06/17/23 1700     Visit Number 1    Number of Visits 13   including eval   Date for OT Re-Evaluation 08/14/23    Authorization Type [employer] required, VL: 30    OT Start Time 0849    OT Stop Time 0928    OT Time Calculation (min) 39 min    Activity Tolerance Patient tolerated treatment well    Behavior During Therapy [facility] for tasks assessed/performed             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   Influenza due to influenza virus, type B 04/12/2014   Migraine    Sepsis (HCC) 04/11/2014   Past Surgical History:  Procedure Laterality Date   BREAST EXCISIONAL BIOPSY Left 1994   BREAST SURGERY     COLONOSCOPY     SIGMOIDOSCOPY     some upper Gi test     pt states was not EGD   Patient Active Problem List   Diagnosis Date Noted   Non-alcoholic fatty liver disease 03/20/2023    ONSET DATE: 06/04/2023 (referral date)  REFERRING DIAG: M79.644,G89.29 (ICD-10-CM) - Chronic pain of right thumb   THERAPY DIAG:  Other lack of coordination  Muscle weakness (generalized)  Other symptoms and signs involving the musculoskeletal system  Other disturbances of skin sensation  Rationale for Evaluation and Treatment: Rehabilitation  SUBJECTIVE:   SUBJECTIVE STATEMENT: Goes by: [name]  Pt reported waking up and whole hand was numb. Pt reported often wearing splint at night to prevent bending wrists and sometimes sleeps with arms overhead. Pt reported hands started bothering pt a few years ago after contracting Covid. Pt had injections in both hands twice which improved  symptoms though symptoms returned after contracting Covid again in January 2025. Pt reported doctor recommended avoiding injections again. Pt reported has not had imaging and questioning arthritis. Pt reported "triggering" with R thumb "it's hinging like it'll stick in one spot and then flick back" when actively moving the R thumb. Pt reported both thumbs have this sensation though R thumb is worse.   Pt accompanied by: self  PERTINENT HISTORY: anxiety, hx of migraine/headache   Per 06/04/23 PA-C Progress Notes: "Chronic pain of right thumb... Refer to occupational therapy for hand strengthening and massage techniques."  PRECAUTIONS: None  RED FLAGS: None   WEIGHT BEARING RESTRICTIONS: No  PAIN:  Are you having pain? Yes: NPRS scale: 3-4/10 currently. 10/10 pain when waking up this morning Pain location: R elbow to hand Pain description: sore Aggravating factors: grasping with thumb: e.g. opening containers, turning a key Relieving factors: moving  FALLS: Has patient fallen in last 6 months? No  LIVING ENVIRONMENT: Lives with: lives with their family Lives in: House/apartment Stairs: Yes: Internal: 12 steps; can reach both and External: 5 steps; can reach both Has following equipment at home: Single point cane, Walker - 2 wheeled, Wheelchair (manual), [employer], and Grab bars  PLOF: Independent with basic ADLs, currently working as Advertising account planner and completing massages, currently driving  PATIENT GOALS: "to be able to learn to do things  differently that won't put such stress on my hand"  NEXT MD VISIT: not yet scheduled  OBJECTIVE:  Note: Objective measures were completed at Evaluation unless otherwise noted.  HAND DOMINANCE: Right  ADLs: Eating: ind Grooming: ind, difficulty using hair dryer and hair styling tools d/t difficulty grasping objects Upper body dressing: ind, difficulty with zippers/buttons Lower body dressing: ind, wears slip-on shoes, sometimes  difficult to button jeans Bathing: no concerns  Handwriting: pt has adjusted grasp patten from standard tripod grip to modified tripod grip and other grip patterns d/t pain. Pt wrote simple sentence with standard tripod grip pattern with 100% legibility though OT noted pt demo'd tighter grasp as task progressed. Pt reported handwriting currently at 60% of current level of function.  Opening jars/containers: difficult  Occupation: Advertising account planner and massage - pt reported repetitive tasks at work and has attempted changing position to avoid using thumb when completing massage which has now aggravated wrist. Pt reported pain when squeezing product out of tube containers and when using nail clippers.   FUNCTIONAL OUTCOME MEASURES: Quick Dash: 52.3% deficit    UPPER EXTREMITY ROM:     Active ROM Right eval Left eval  Shoulder flexion [facility]   Shoulder abduction    Shoulder adduction    Shoulder extension    Shoulder internal rotation    Shoulder external rotation    Elbow flexion    Elbow extension    Wrist flexion WFL - pt c/o pain    Wrist extension WFL- pt c/o pain when lifting objects when in wrist ext   Wrist ulnar deviation    Wrist radial deviation    Wrist pronation WFL - per pt "stiff"   Wrist supination WFL   (Blank rows = not tested)  Active ROM Right eval Left eval  Thumb MCP (0-60)    Thumb IP (0-80)    Thumb Radial abd/add (0-55)     Thumb Palmar abd/add (0-45)     Thumb Opposition to Small Finger     Index MCP (0-90)     Index PIP (0-100)     Index DIP (0-70)      Long MCP (0-90)      Long PIP (0-100)      Long DIP (0-70)      Ring MCP (0-90)      Ring PIP (0-100)      Ring DIP (0-70)      Little MCP (0-90)      Little PIP (0-100)      Little DIP (0-70)      (Blank rows = not tested)   UPPER EXTREMITY MMT:     MMT Right eval Left eval  Shoulder flexion    Shoulder abduction    Shoulder adduction    Shoulder extension    Shoulder internal  rotation    Shoulder external rotation    Middle trapezius    Lower trapezius    Elbow flexion    Elbow extension    Wrist flexion    Wrist extension    Wrist ulnar deviation    Wrist radial deviation    Wrist pronation    Wrist supination    (Blank rows = not tested)  RUE: Digits 2-5 WFL flex/ext. Thumb IP joint - impaired. Pt reported thumb pain with thumb flex. Pt reported "stabbing pain sometimes in center of anterior palm when grasping objects mainly in morning.  LUE: Digits 1-5 WFL flex/ext.  HAND FUNCTION: Grip strength: Right: 27, 27, 28 (27.3  lbs average) lbs; Left: 36, 39, 37 (37.3 lbs average)  lbs  Pt reported pain of R thumb when holding dynamometer. Pt questioning if "cyst" is present.  COORDINATION: 9 Hole Peg test: Right: 24 sec with x1 drop; Left: 22 sec with no drops.  Pt reported dropping items more frequently with RUE "a couple times per day."   SENSATION: Pt reported numb when waking up this morning which happens frequently. Pt reported it can take 1-2 hours to resolve.   EDEMA:  Pt reported swelling at R thumb IP joint, sometimes wraps thumb using First Aid tape. Pt questioning presence of "cyst" at base of thumb near MCP joint on anterior side and OT noted more prominent bony feel at RUE location of discomfort compared to LUE.  COGNITION: Overall cognitive status: Within functional limits for tasks assessed  OBSERVATIONS: Pt was pleasant and ambulated ind without A/E.   TREATMENT DATE:                                                                                                                              OT educated pt on OT role, POC, UE anatomy. OT recommended to pt to bring splint to next session. Pt verbalized understanding of all.   PATIENT EDUCATION: Education details: see today's tx above Person educated: Patient Education method: Explanation Education comprehension: verbalized understanding  HOME EXERCISE  PROGRAM: TBD  GOALS: Goals reviewed with patient? Yes  SHORT TERM GOALS: Target date: 07/17/23  Pt will be ind with HEP using visual handouts. Baseline: new to outpt OT Goal status: INITIAL  2.  Pt will ind recall at least 3 joint protection strategies. Baseline: new to outpt OT. Pt reporting hand pain with many ADL/IADL tasks. Goal status: INITIAL  3.  Pt will verbalize understanding of sleep positioning strategies.  Baseline: Pt reported waking up and hands are numb. Goal status: INITIAL  4.  Pt will verbalize understanding of splint wear/care schedule and purpose of splint.  Baseline: Pt reported pt has splint and sometimes wears at night to prevent wrist movement during sleep. Goal status: INITIAL  5.  Pt will ind recall at least 3 edema management and/or pain management strategies. Baseline: Pt demo'ing pain and edema primarily affecting R thumb. Goal status: INITIAL  LONG TERM GOALS: Target date: 08/14/23  Patient will demo improved FM coordination as evidenced by completing nine-hole peg with use of RUE in 22 seconds or less with no more than 1 drop. Baseline: 9 Hole Peg test: Right: 24 sec with x1 drop; Left: 22 sec with no drops. Goal status: INITIAL  2.  Patient will demonstrate at least 31 lbs RUE grip strength as needed to open jars and other containers.  Baseline: Grip strength: Right: 27, 27, 28 (27.3 lbs average) lbs; Left: 36, 39, 37 (37.3 lbs average)  lbs Goal status: INITIAL  3.  Patient will demonstrate at least 16% improvement with quick Dash score (reporting 36.3% disability or  less) indicating improved functional use of affected extremity.  Baseline: Quick Dash: 52.3% deficit Goal status: INITIAL  4.  Pt will verbalize or demo understanding of A/E options and adaptive strategies for ADL/IADL tasks, including handwriting and work-related tasks, in order to decrease pain of RUE. Baseline: Pt reported pain during many functional tasks, including  handwriting and many work-related tasks.  Goal status: INITIAL   ASSESSMENT:  CLINICAL IMPRESSION: Patient is a 53 y.o. female who was seen today for occupational therapy evaluation for Chronic pain of right thumb. Hx includes anxiety, hx of migraine/headache. Patient currently presents below baseline level of functioning demonstrating functional deficits and impairments as noted below. Pt would benefit from skilled OT services in the outpatient setting to work on impairments as noted below.  PERFORMANCE DEFICITS: in functional skills including ADLs, IADLs, coordination, dexterity, sensation, edema, strength, pain, flexibility, Fine motor control, Gross motor control, mobility, body mechanics, endurance, and UE functional use, cognitive skills including energy/drive, and psychosocial skills including environmental adaptation.   IMPAIRMENTS: are limiting patient from ADLs, IADLs, rest and sleep, work, leisure, and social participation.   COMORBIDITIES: has no other co-morbidities that affects occupational performance. Patient will benefit from skilled OT to address above impairments and improve overall function.  MODIFICATION OR ASSISTANCE TO COMPLETE EVALUATION: Min-Moderate modification of tasks or assist with assess necessary to complete an evaluation.  OT OCCUPATIONAL PROFILE AND HISTORY: Detailed assessment: Review of records and additional review of physical, cognitive, psychosocial history related to current functional performance.  CLINICAL DECISION MAKING: Moderate - several treatment options, min-mod task modification necessary  REHAB POTENTIAL: Good  EVALUATION COMPLEXITY: Moderate      PLAN:  OT FREQUENCY: 2x/week  OT DURATION: 6 weeks  PLANNED INTERVENTIONS: 97168 OT Re-evaluation, 97535 self care/ADL training, 16109 therapeutic exercise, 97530 therapeutic activity, 97112 neuromuscular re-education, 97140 manual therapy, 97035 ultrasound, 97018 paraffin, 60454  fluidotherapy, 97760 Orthotic Initial, 97761 Prosthetic Initial, 97763 Orthotic/Prosthetic subsequent, passive range of motion, functional mobility training, energy conservation, patient/family education, and DME and/or AE instructions  RECOMMENDED OTHER SERVICES: n/a  CONSULTED AND AGREED WITH PLAN OF CARE: Patient  PLAN FOR NEXT SESSION:  HEP: Tendon glides and gentle thumb AROM Sleep positioning handout Joint protection handout A/E and adaptive strategies for ADLs/IADLs (e.g. handwriting, see objective information above for details about specific activities) Assess splint fit - pt has over-the-counter splint and wears at night Edema management and pain management strategies (e.g. modalities)    For all possible CPT codes, reference the Planned Interventions line above.     Check all conditions that are expected to impact treatment: {Conditions expected to impact treatment:Musculoskeletal disorders   If treatment provided at initial evaluation, no treatment charged due to lack of authorization.       Oakley Bellman, OT 06/18/2023, 8:41 AM

## 2023-06-18 NOTE — Addendum Note (Signed)
 Addended by: Faren Florence E on: 06/18/2023 08:47 AM   Modules accepted: Orders

## 2023-06-19 ENCOUNTER — Ambulatory Visit: Admitting: Occupational Therapy

## 2023-06-19 DIAGNOSIS — R278 Other lack of coordination: Secondary | ICD-10-CM

## 2023-06-19 DIAGNOSIS — M6281 Muscle weakness (generalized): Secondary | ICD-10-CM

## 2023-06-19 NOTE — Therapy (Signed)
 OUTPATIENT OCCUPATIONAL THERAPY ORTHO  Treatment Note  Patient Name: Jill Melendez MRN: [id number] DOB:November 10, 1970, 53 y.o., female Today's Date: 06/19/2023  PCP: Jill Frock, PA-C  REFERRING PROVIDER: Trenton Frock, PA-C   END OF SESSION:  OT End of Session - 06/19/23 0813     Visit Number 2    Number of Visits 13   including eval   Date for OT Re-Evaluation 08/14/23    Authorization Type [employer] [place] required, VL: 30    OT Start Time 0802    OT Stop Time 0844    OT Time Calculation (min) 42 min    Activity Tolerance Patient tolerated treatment well    Behavior During Therapy [facility] for tasks assessed/performed              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   Influenza due to influenza virus, type B 04/12/2014   Migraine    Sepsis (HCC) 04/11/2014   Past Surgical History:  Procedure Laterality Date   BREAST EXCISIONAL BIOPSY Left 1994   BREAST SURGERY     COLONOSCOPY     SIGMOIDOSCOPY     some upper Gi test     pt states was not EGD   Patient Active Problem List   Diagnosis Date Noted   Non-alcoholic fatty liver disease 03/20/2023    ONSET DATE: 06/04/2023 (referral date)  REFERRING DIAG: M79.644,G89.29 (ICD-10-CM) - Chronic pain of right thumb   THERAPY DIAG:  Other lack of coordination  Muscle weakness (generalized)  Rationale for Evaluation and Treatment: Rehabilitation  SUBJECTIVE:   SUBJECTIVE STATEMENT: Goes byPeterson Melendez  She asked me to bring my splints so you can check them out.  Pt accompanied by: self  PERTINENT HISTORY: anxiety, hx of migraine/headache   Per 06/04/23 PA-C Progress Notes: "Chronic pain of right thumb... Refer to occupational therapy for hand strengthening and massage techniques."  PRECAUTIONS: None  RED FLAGS: None   WEIGHT BEARING RESTRICTIONS: No  PAIN:  Are you  having pain? Yes: NPRS scale: 4/10 currently. 10/10 pain and numbness when she woke up this morning Pain location: R elbow to hand Pain description: sore Aggravating factors: grasping with thumb: e.g. opening containers, turning a key Relieving factors: moving  FALLS: Has patient fallen in last 6 months? No  LIVING ENVIRONMENT: Lives with: lives with their family Lives in: House/apartment Stairs: Yes: Internal: 12 steps; can reach both and External: 5 steps; can reach both Has following equipment at home: Single point cane, Walker - 2 wheeled, Wheelchair (manual), Tour manager, and Grab bars  PLOF: Independent with basic ADLs, currently working as Advertising account planner and completing massages, currently driving  PATIENT GOALS: "to be able to learn to do things differently that won't put such stress on my hand"  NEXT MD VISIT: not yet scheduled  OBJECTIVE:  Note: Objective measures were completed at Evaluation unless otherwise noted.  HAND DOMINANCE: Right  ADLs: Eating: ind Grooming: ind, difficulty using hair dryer and hair styling tools d/t difficulty grasping objects Upper body dressing: ind, difficulty with zippers/buttons Lower body dressing: ind, wears slip-on shoes, sometimes difficult to button jeans Bathing: no concerns  Handwriting: pt has adjusted grasp patten from standard tripod grip to modified tripod grip and other grip patterns d/t pain. Pt wrote simple sentence with standard tripod grip pattern with 100% legibility though OT  noted pt demo'd tighter grasp as task progressed. Pt reported handwriting currently at 60% of current level of function.  Opening jars/containers: difficult  Occupation: Advertising account planner and massage - pt reported repetitive tasks at work and has attempted changing position to avoid using thumb when completing massage which has now aggravated wrist. Pt reported pain when squeezing product out of tube containers and when using nail clippers.    FUNCTIONAL OUTCOME MEASURES: Quick Dash: 52.3% deficit    UPPER EXTREMITY ROM:     Active ROM Right eval Left eval  Shoulder flexion [facility]   Shoulder abduction    Shoulder adduction    Shoulder extension    Shoulder internal rotation    Shoulder external rotation    Elbow flexion    Elbow extension    Wrist flexion WFL - pt c/o pain    Wrist extension WFL- pt c/o pain when lifting objects when in wrist ext   Wrist ulnar deviation    Wrist radial deviation    Wrist pronation WFL - per pt "stiff"   Wrist supination WFL   (Blank rows = not tested)  Active ROM Right eval Left eval  Thumb MCP (0-60)    Thumb IP (0-80)    Thumb Radial abd/add (0-55)     Thumb Palmar abd/add (0-45)     Thumb Opposition to Small Finger     Index MCP (0-90)     Index PIP (0-100)     Index DIP (0-70)      Long MCP (0-90)      Long PIP (0-100)      Long DIP (0-70)      Ring MCP (0-90)      Ring PIP (0-100)      Ring DIP (0-70)      Little MCP (0-90)      Little PIP (0-100)      Little DIP (0-70)      (Blank rows = not tested)   UPPER EXTREMITY MMT:     MMT Right eval Left eval  Shoulder flexion    Shoulder abduction    Shoulder adduction    Shoulder extension    Shoulder internal rotation    Shoulder external rotation    Middle trapezius    Lower trapezius    Elbow flexion    Elbow extension    Wrist flexion    Wrist extension    Wrist ulnar deviation    Wrist radial deviation    Wrist pronation    Wrist supination    (Blank rows = not tested)  RUE: Digits 2-5 WFL flex/ext. Thumb IP joint - impaired. Pt reported thumb pain with thumb flex. Pt reported "stabbing pain sometimes in center of anterior palm when grasping objects mainly in morning.  LUE: Digits 1-5 WFL flex/ext.  HAND FUNCTION: Grip strength: Right: 27, 27, 28 (27.3 lbs average) lbs; Left: 36, 39, 37 (37.3 lbs average)  lbs  Pt reported pain of R thumb when holding dynamometer. Pt questioning if "cyst"  is present.  COORDINATION: 9 Hole Peg test: Right: 24 sec with x1 drop; Left: 22 sec with no drops.  Pt reported dropping items more frequently with RUE "a couple times per day."   SENSATION: Pt reported numb when waking up this morning which happens frequently. Pt reported it can take 1-2 hours to resolve.   EDEMA:  Pt reported swelling at R thumb IP joint, sometimes wraps thumb using First Aid tape. Pt questioning presence of "cyst" at  base of thumb near MCP joint on anterior side and OT noted more prominent bony feel at RUE location of discomfort compared to LUE.  COGNITION: Overall cognitive status: Within functional limits for tasks assessed  OBSERVATIONS: Pt was pleasant and ambulated ind without A/E.   TREATMENT DATE:       06/19/23 Splint assessment: pt brought in personal resting hand splints.  OT had pt don and then provided with written instructions on tightening strap at wrist and forearm to maintain positioning and decrease slipping of splint during wear or sleep.  OT reiterating recommendation to be able to get finger under strap to ensure not tightening too much.  OT having pt angle thumb strap and loose forearm strap to ensure increased fit of brace and reduce slipping. ROM: OT providing demonstration and cues of each movement, encouraging slow and steady movements without jerky or increased pressure, focusing on "gliding" through movements.  Pt reports some "clicking" or "catching" at base of thumb at MP joint with circumduction in clockwise manner on R. - Seated Digit Tendon Gliding  - "Duck mouth"   - Thumb Opposition   - Seated Thumb Composite Flexion AROM  - Seated Thumb Circumduction   Joint protection: OT educated on joint protection strategies and reviewing anatomy of wrist and hands as impacted with prolonged wrist flexion or extension, encouraging neutral positioning as able.  OT encouraging use of additional tools as needed and/or change in position of UE when  opening containers.  OT provided pt with built up handles to aid in decreased pressure on writing and/or eating utensils.                                  PATIENT EDUCATION: Education details: see today's tx above Person educated: Patient Education method: Explanation Education comprehension: verbalized understanding  HOME EXERCISE PROGRAM: Access Code: [email or web address] URL: [email or web address] Date: 06/19/2023 Prepared by: [facility] - Outpatient  Rehab - [facility]  Exercises - Seated Digit Tendon Gliding  - 4 x daily - 5 reps - "Duck mouth"  - 4 x daily - 5 reps - Thumb Opposition  - 4 x daily - 5 reps - Seated Thumb Composite Flexion AROM  - 4 x daily - 5 reps - 5 hold - Seated Thumb Circumduction  - 4 x daily - 5 reps  GOALS: Goals reviewed with patient? Yes  SHORT TERM GOALS: Target date: 07/17/23  Pt will be ind with HEP using visual handouts. Baseline: new to outpt OT Goal status: IN PROGRESS  2.  Pt will ind recall at least 3 joint protection strategies. Baseline: new to outpt OT. Pt reporting hand pain with many ADL/IADL tasks. Goal status: IN PROGRESS  3.  Pt will verbalize understanding of sleep positioning strategies.  Baseline: Pt reported waking up and hands are numb. Goal status: IN PROGRESS  4.  Pt will verbalize understanding of splint wear/care schedule and purpose of splint.  Baseline: Pt reported pt has splint and sometimes wears at night to prevent wrist movement during sleep. Goal status: IN PROGRESS  5.  Pt will ind recall at least 3 edema management and/or pain management strategies. Baseline: Pt demo'ing pain and edema primarily affecting R thumb. Goal status: IN PROGRESS  LONG TERM GOALS: Target date: 08/14/23  Patient will demo improved FM coordination as evidenced by completing nine-hole peg with use of RUE in 22 seconds or less with no more than 1  drop. Baseline: 9 Hole Peg test: Right: 24 sec with x1 drop; Left: 22 sec with  no drops. Goal status: IN PROGRESS  2.  Patient will demonstrate at least 31 lbs RUE grip strength as needed to open jars and other containers.  Baseline: Grip strength: Right: 27, 27, 28 (27.3 lbs average) lbs; Left: 36, 39, 37 (37.3 lbs average)  lbs Goal status: IN PROGRESS  3.  Patient will demonstrate at least 16% improvement with quick Dash score (reporting 36.3% disability or less) indicating improved functional use of affected extremity.  Baseline: Quick Dash: 52.3% deficit Goal status: IN PROGRESS  4.  Pt will verbalize or demo understanding of A/E options and adaptive strategies for ADL/IADL tasks, including handwriting and work-related tasks, in order to decrease pain of RUE. Baseline: Pt reported pain during many functional tasks, including handwriting and many work-related tasks.  Goal status: IN PROGRESS   ASSESSMENT:  CLINICAL IMPRESSION: Pt tolerating tendon glides and thumb AROM this session with reports of "clicking" with circumduction but no pain.   Pt demonstrating understanding of recommendations for splint wear, taking a picture to ensure proper repeat fit.  Pt continues to demonstrate and report limiting tolerance to extreme thumb flexion and circumduction.  Pt reporting pain with composite thumb flexion, therefore recommended pt back of slightly with ROM and hold at near end range to progress ROM and tolerance.  Patient currently presents below baseline level of functioning demonstrating functional deficits and impairments as noted below. Pt would benefit from skilled OT services in the outpatient setting to work on impairments as noted below.  PERFORMANCE DEFICITS: in functional skills including ADLs, IADLs, coordination, dexterity, sensation, edema, strength, pain, flexibility, Fine motor control, Gross motor control, mobility, body mechanics, endurance, and UE functional use, cognitive skills including energy/drive, and psychosocial skills including environmental  adaptation.     PLAN:  OT FREQUENCY: 2x/week  OT DURATION: 6 weeks  PLANNED INTERVENTIONS: 97168 OT Re-evaluation, 97535 self care/ADL training, 91478 therapeutic exercise, 97530 therapeutic activity, 97112 neuromuscular re-education, 97140 manual therapy, 97035 ultrasound, 97018 paraffin, 29562 fluidotherapy, 97760 Orthotic Initial, 97761 Prosthetic Initial, 97763 Orthotic/Prosthetic subsequent, passive range of motion, functional mobility training, energy conservation, patient/family education, and DME and/or AE instructions  RECOMMENDED OTHER SERVICES: n/a  CONSULTED AND AGREED WITH PLAN OF CARE: Patient  PLAN FOR NEXT SESSION:  REVIEW and add to PRN - HEP: Tendon glides and gentle thumb AROM Sleep positioning handout Joint protection handout - REVIEW PRN A/E and adaptive strategies for ADLs/IADLs (e.g. handwriting, see objective information above for details about specific activities) Assess splint fit - pt has over-the-counter splint and wears at night - did it fit better decreased pain/numbness in the morning? Edema management and pain management strategies (e.g. modalities)      Anthonette Kinsman, OTR/L 06/19/2023, 8:47 AM   [facility] at [facility] [address] [place], [address] [place], [place] [address] Phone # [phone] Fax # [phone]

## 2023-06-22 ENCOUNTER — Encounter: Payer: Self-pay | Admitting: Internal Medicine

## 2023-06-22 ENCOUNTER — Ambulatory Visit (INDEPENDENT_AMBULATORY_CARE_PROVIDER_SITE_OTHER): Admitting: Internal Medicine

## 2023-06-22 ENCOUNTER — Other Ambulatory Visit (INDEPENDENT_AMBULATORY_CARE_PROVIDER_SITE_OTHER)

## 2023-06-22 VITALS — BP 122/68 | HR 85 | Ht 60.0 in | Wt 125.0 lb

## 2023-06-22 DIAGNOSIS — Z789 Other specified health status: Secondary | ICD-10-CM | POA: Diagnosis not present

## 2023-06-22 DIAGNOSIS — R1011 Right upper quadrant pain: Secondary | ICD-10-CM

## 2023-06-22 DIAGNOSIS — K838 Other specified diseases of biliary tract: Secondary | ICD-10-CM | POA: Diagnosis not present

## 2023-06-22 LAB — CBC WITH DIFFERENTIAL/PLATELET
Basophils Absolute: 0.1 10*3/uL (ref 0.0–0.1)
Basophils Relative: 0.9 % (ref 0.0–3.0)
Eosinophils Absolute: 0 10*3/uL (ref 0.0–0.7)
Eosinophils Relative: 0.8 % (ref 0.0–5.0)
HCT: 38.8 % (ref 36.0–46.0)
Hemoglobin: 13.4 g/dL (ref 12.0–15.0)
Lymphocytes Relative: 26.7 % (ref 12.0–46.0)
Lymphs Abs: 1.6 10*3/uL (ref 0.7–4.0)
MCHC: 34.4 g/dL (ref 30.0–36.0)
MCV: 88.9 fl (ref 78.0–100.0)
Monocytes Absolute: 0.4 10*3/uL (ref 0.1–1.0)
Monocytes Relative: 7 % (ref 3.0–12.0)
Neutro Abs: 3.8 10*3/uL (ref 1.4–7.7)
Neutrophils Relative %: 64.6 % (ref 43.0–77.0)
Platelets: 160 10*3/uL (ref 150.0–400.0)
RBC: 4.37 Mil/uL (ref 3.87–5.11)
RDW: 12.6 % (ref 11.5–15.5)
WBC: 5.9 10*3/uL (ref 4.0–10.5)

## 2023-06-22 LAB — HEPATIC FUNCTION PANEL
ALT: 15 U/L (ref 0–35)
AST: 19 U/L (ref 0–37)
Albumin: 4.6 g/dL (ref 3.5–5.2)
Alkaline Phosphatase: 59 U/L (ref 39–117)
Bilirubin, Direct: 0.2 mg/dL (ref 0.0–0.3)
Total Bilirubin: 1.1 mg/dL (ref 0.2–1.2)
Total Protein: 7.1 g/dL (ref 6.0–8.3)

## 2023-06-22 NOTE — Patient Instructions (Signed)
 Your provider has requested that you go to the basement level for lab work before leaving today. Press "B" on the elevator. The lab is located at the first door on the left as you exit the elevator.  Due to recent changes in healthcare laws, you may see the results of your imaging and laboratory studies on MyChart before your provider has had a chance to review them.  We understand that in some cases there may be results that are confusing or concerning to you. Not all laboratory results come back in the same time frame and the provider may be waiting for multiple results in order to interpret others.  Please give us  48 hours in order for your provider to thoroughly review all the results before contacting the office for clarification of your results.   You have been scheduled for an MRCP at [facility]  on 06/26/2023. Your appointment time is 3:30pm. Please arrive to admitting (at main entrance of the hospital) 15 minutes prior to your appointment time for registration purposes. Please make certain not to have anything to eat or drink 4  hours prior to your test. In addition, if you have any metal in your body, have a pacemaker or defibrillator, please be sure to let your ordering physician know. This test typically takes 45 minutes to 1 hour to complete. Should you need to reschedule, please call [phone] to do so.  I appreciate the opportunity to care for you. Loy Ruff, MD, [facility]

## 2023-06-22 NOTE — Progress Notes (Signed)
 Jill Melendez 53 y.o. 04-10-1970 [id number]  Assessment & Plan:   Encounter Diagnoses  Name Primary?   Dilated intrahepatic bile duct Yes   RUQ pain    Vegan    Gilbert's syndrome     Evaluate dilated intrahepatic bile duct and right upper quadrant pain with MR MRCP with and without contrast.  My overall index of suspicion for serious liver and bile duct pathology is low.  There is a background of IBS and chronic recurrent abdominal pain of unclear etiology.  It could be related to the liver and bile ducts we will see what the MRCP shows.  She has had increased echogenicity of the liver.  It might not be fatty liver even though that is consistent with a.  An MRI should help us  sort that out further.  Transaminases are normal the platelets were just slightly low.  She does not use alcohol.  Note that records review shows a normal abdominal ultrasound dating back to October 11, 2013, for right upper quadrant pain.  I reviewed Gilbert's syndrome with her and the innocent nature of that.  No follow-up needed on that.  She is aware that the vegan diet does typically require supplements with things like B12 she has put those on hold but is aware she should restart those at some point.  She believes she is intolerant of nightshades and avoids those.  I have explained that we may not be able to determine a clear etiology of this chronic pain.   Orders Placed This Encounter  Procedures   MR ABDOMEN MRCP W WO CONTAST   Hepatic function panel   CBC with Differential/Platelet     CC: Trenton Frock, PA-C      Subjective:  The patient consented to the use of artificial intelligence scribe application.  Chief Complaint: Dilated bile ducts and right upper quadrant pain  HPI 53 year old woman with a history of IBS, H. pylori gastritis last seen by me in 2017, who presents because of an ultrasound showing a dilated intrahepatic duct at 6.8 mm and increased echogenicity and right  upper quadrant pain.   She has experienced persistent right upper quadrant pain for the past five years, described as a pressure sensation when sitting, sometimes feeling like a 'football' under her ribs, accompanied by sharp pains and a sensation of heat. Nausea occurs without vomiting, and she occasionally experiences diarrhea following sharp pains and nausea. Previous explanations for the pain included a pulled muscle or gallstones though imaging has not shown gallstones.  The symptoms are there much of the time and not necessarily related to eating etc.  Probably 5 years in duration.   In 2018, she experienced severe IBS-D symptoms, leading her to adopt a vegan diet. She reports a significant reaction to nightshades, causing joint pain and immobility, which she avoids to prevent symptoms. Since eliminating nightshades, her bowel movements have become more regular, though she still experiences occasional diarrhea with acute pain episodes.  Due to her chronic symptoms she has had evaluations off and on.  She had 1 through [place] with an ultrasound in 2024 not show in epic but she had a 5.2 mm common bile duct otherwise negative exam.  More recently she was evaluated by her current primary care provider Ms. Drubel with an office visit on April 10 due to epigastric pain (though she describes right upper quadrant pain to me) and had a workup. On April 11 an ultrasound of the right upper quadrant showed intrahepatic and extrahepatic  biliary ductal dilation of 6.8 mm.  Mild increased echotexture of the liver.  Otherwise negative.  She has a chronically mildly elevated bilirubin that is all unfractionated or mostly so.  Consistent with Gilbert's syndrome.  CBC on April 10 white count 3.6 platelets 148 hemoglobin 14.2.  Vitamin D  was 41.39.  Bilirubin 1.6 alk phos 73 AST 20 ALT 17.  Renal function and electrolytes otherwise normal.  H. pylori breath test negative.  Other symptoms at that time were chronic right  thumb pain and headache problems.  She does remain somewhat concerned because her paternal grandfather had colon cancer and both her parents have had polyps so she does not know how many or type.   EGD 07/03/2015 with gastritis and enlarged gastric folds which revealed Helicobacter pylori gastritis which was treated with quadruple therapy (PPI, Pepto-Bismol, metronidazole  and doxycycline ).  Eradication was confirmed with stool testing later on. Colonoscopy 09/10/2015 normal including terminal ileum with normal random colon biopsies. Wt Readings from Last 3 Encounters:  06/22/23 125 lb (56.7 kg)  06/15/23 124 lb 12.8 oz (56.6 kg)  06/04/23 124 lb (56.2 kg)    Allergies  Allergen Reactions   Other Anaphylaxis    Night Shade veg-joint pain   Advair Diskus [Fluticasone-Salmeterol] Other (See Comments)    Visual changes    Bee Venom Swelling   Buspirone Other (See Comments)    Severe headache   Codeine Itching and Nausea And Vomiting   Fluoxetine Other (See Comments)    Felt weird   Ibuprofen Other (See Comments)    Feels like she is sedated   Nsaids Other (See Comments)    Feels sedated   Sertraline Hcl Other (See Comments)    Couldn't sleep; OCD   Latex Rash    Blisters    Current Meds  Medication Sig   cetirizine (ZYRTEC) 10 MG chewable tablet Chew 10 mg by mouth daily.   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   Self-employed esthetician nail tech   Caffeine use:  none. Stopped drinking any caffeine in 2014.    family history includes Breast cancer in her paternal aunt; COPD in her father and maternal grandfather; Cancer in her father, maternal grandfather, and paternal aunt; Colon cancer (age of onset: 25) in her paternal grandfather; Colon polyps in her mother; Early death in her paternal grandmother; Hearing loss in her father; Heart disease in her maternal grandmother, mother, and paternal grandmother; High blood pressure in her mother; Hypertension in her mother; Kidney disease in her father and maternal grandfather; Stroke in her mother; Varicose Veins in her maternal grandmother; Vision loss in her mother.   Review of Systems As per HPI.  Also allergies joint pain fatigue night sweats itching.  Otherwise negative.  Objective:   Physical Exam @BP  122/68   Pulse 85   Ht 5' (1.524  m)   Wt 125 lb (56.7 kg)   LMP 09/24/2021 (Approximate)   BMI 24.41 kg/m @  General:  NAD Eyes:   anicteric Lungs:  clear Heart::  S1S2 no rubs, murmurs or gallops Abdomen:  soft and nontender, BS+, no HSM/mass and negative Carnett's Ext:   no edema, cyanosis or clubbing    Data Reviewed:  See HPI

## 2023-06-23 ENCOUNTER — Ambulatory Visit: Admitting: Occupational Therapy

## 2023-06-23 DIAGNOSIS — R29898 Other symptoms and signs involving the musculoskeletal system: Secondary | ICD-10-CM

## 2023-06-23 DIAGNOSIS — M6281 Muscle weakness (generalized): Secondary | ICD-10-CM

## 2023-06-23 DIAGNOSIS — R208 Other disturbances of skin sensation: Secondary | ICD-10-CM

## 2023-06-23 DIAGNOSIS — R278 Other lack of coordination: Secondary | ICD-10-CM

## 2023-06-23 NOTE — Patient Instructions (Signed)
 Jill Melendez

## 2023-06-23 NOTE — Therapy (Signed)
 OUTPATIENT OCCUPATIONAL THERAPY ORTHO  Treatment Note  Patient Name: Jill Melendez MRN: [id number] DOB:[birth year], 53 y.o., female Today's Date: 06/23/2023  PCP: Trenton Frock, PA-C  REFERRING PROVIDER: Trenton Frock, PA-C   END OF SESSION:  OT End of Session - 06/23/23 0836     Visit Number 3    Number of Visits 13   including eval   Date for OT Re-Evaluation 08/14/23    Authorization Type [employer] required, VL: 30    OT Start Time 0802    OT Stop Time 0844    OT Time Calculation (min) 42 min    Activity Tolerance Patient tolerated treatment well    Behavior During Therapy [facility] for tasks assessed/performed               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   Influenza due to influenza virus, type B 04/12/2014   Migraine    Sepsis (HCC) 04/11/2014   Past Surgical History:  Procedure Laterality Date   BREAST EXCISIONAL BIOPSY Left 1994   BREAST SURGERY     COLONOSCOPY     SIGMOIDOSCOPY     some upper Gi test     pt states was not EGD   Patient Active Problem List   Diagnosis Date Noted   Non-alcoholic fatty liver disease 03/20/2023    ONSET DATE: 06/04/2023 (referral date)  REFERRING DIAG: M79.644,G89.29 (ICD-10-CM) - Chronic pain of right thumb   THERAPY DIAG:  Other lack of coordination  Muscle weakness (generalized)  Other symptoms and signs involving the musculoskeletal system  Other disturbances of skin sensation  Rationale for Evaluation and Treatment: Rehabilitation  SUBJECTIVE:   SUBJECTIVE STATEMENT: Goes by: Jill Melendez  Pt reports when I wake up in the mornings it feels like an ice pick is going through my hand.  "I do feel like the exercises are allowing me more movement."  Pt accompanied by: self  PERTINENT HISTORY: anxiety, hx of migraine/headache   Per 06/04/23 PA-C Progress Notes:  "Chronic pain of right thumb... Refer to occupational therapy for hand strengthening and massage techniques."  PRECAUTIONS: None  RED FLAGS: None   WEIGHT BEARING RESTRICTIONS: No  PAIN:  Are you having pain? Yes: NPRS scale: 5/10 currently. 10/10 pain and numbness when she woke up this morning Pain location: R elbow to hand Pain description: sore Aggravating factors: grasping with thumb: e.g. opening containers, turning a key Relieving factors: moving  FALLS: Has patient fallen in last 6 months? No  LIVING ENVIRONMENT: Lives with: lives with their family Lives in: House/apartment Stairs: Yes: Internal: 12 steps; can reach both and External: 5 steps; can reach both Has following equipment at home: Single point cane, Walker - 2 wheeled, Wheelchair (manual), Tour manager, and Grab bars  PLOF: Independent with basic ADLs, currently working as Advertising account planner and completing massages, currently driving  PATIENT GOALS: "to be able to learn to do things differently that won't put such stress on my hand"  NEXT MD VISIT: not yet scheduled  OBJECTIVE:  Note: Objective measures were completed at Evaluation unless otherwise noted.  HAND DOMINANCE: Right  ADLs: Eating: ind Grooming: ind, difficulty using hair dryer and hair styling tools d/t difficulty grasping objects Upper body dressing: ind, difficulty with zippers/buttons Lower body dressing: ind, wears slip-on shoes, sometimes difficult to button jeans Bathing: no concerns  Handwriting: pt has adjusted grasp patten from standard tripod grip to modified tripod grip and other grip patterns d/t pain. Pt wrote simple sentence with standard tripod grip pattern with 100% legibility though OT noted pt demo'd tighter grasp as task progressed. Pt reported handwriting currently at 60% of current level of function.  Opening jars/containers: difficult  Occupation: Advertising account planner and massage - pt reported repetitive tasks at work and has  attempted changing position to avoid using thumb when completing massage which has now aggravated wrist. Pt reported pain when squeezing product out of tube containers and when using nail clippers.   FUNCTIONAL OUTCOME MEASURES: Quick Dash: 52.3% deficit    UPPER EXTREMITY ROM:     Active ROM Right eval Left eval  Shoulder flexion [facility]   Shoulder abduction    Shoulder adduction    Shoulder extension    Shoulder internal rotation    Shoulder external rotation    Elbow flexion    Elbow extension    Wrist flexion WFL - pt c/o pain    Wrist extension WFL- pt c/o pain when lifting objects when in wrist ext   Wrist ulnar deviation    Wrist radial deviation    Wrist pronation WFL - per pt "stiff"   Wrist supination WFL   (Blank rows = not tested)  Active ROM Right eval Left eval  Thumb MCP (0-60)    Thumb IP (0-80)    Thumb Radial abd/add (0-55)     Thumb Palmar abd/add (0-45)     Thumb Opposition to Small Finger     Index MCP (0-90)     Index PIP (0-100)     Index DIP (0-70)      Long MCP (0-90)      Long PIP (0-100)      Long DIP (0-70)      Ring MCP (0-90)      Ring PIP (0-100)      Ring DIP (0-70)      Little MCP (0-90)      Little PIP (0-100)      Little DIP (0-70)      (Blank rows = not tested)   UPPER EXTREMITY MMT:     MMT Right eval Left eval  Shoulder flexion    Shoulder abduction    Shoulder adduction    Shoulder extension    Shoulder internal rotation    Shoulder external rotation    Middle trapezius    Lower trapezius    Elbow flexion    Elbow extension    Wrist flexion    Wrist extension    Wrist ulnar deviation    Wrist radial deviation    Wrist pronation    Wrist supination    (Blank rows = not tested)  RUE: Digits 2-5 WFL flex/ext. Thumb IP joint - impaired. Pt reported thumb pain with thumb flex. Pt reported "stabbing pain sometimes in center of anterior palm when grasping objects mainly in morning.  LUE: Digits 1-5 WFL  flex/ext.  HAND FUNCTION: Grip strength: Right: 27, 27, 28 (27.3 lbs average) lbs; Left: 36, 39, 37 (37.3 lbs average)  lbs  Pt reported pain of R thumb when holding dynamometer. Pt questioning if "cyst" is present.  COORDINATION: 9 Hole Peg test: Right: 24 sec with x1 drop; Left: 22 sec with no drops.  Pt reported dropping items more frequently with RUE "a couple times per day."   SENSATION: Pt reported numb when waking up this morning which happens frequently.  Pt reported it can take 1-2 hours to resolve.   EDEMA:  Pt reported swelling at R thumb IP joint, sometimes wraps thumb using First Aid tape. Pt questioning presence of "cyst" at base of thumb near MCP joint on anterior side and OT noted more prominent bony feel at RUE location of discomfort compared to LUE.  COGNITION: Overall cognitive status: Within functional limits for tasks assessed  OBSERVATIONS: Pt was pleasant and ambulated ind without A/E.   TREATMENT DATE:       06/23/23 Heat: applied moist heat to R hand for 10 mins while engaging in discussion about heat recommendations prior to exercises.  Pt reports understanding.  Provided with handout of rationale of heat and types of moist heat.   Self-care: Engaged in discussion of modifying strap placement on overnight resting hand splint, OT providing demonstration of placement and providing pt with additional piece of strapping to aid in securing hand splint as thumb continues to slide out.  Engaging in discussion of purchasing additional foam grip handles as pt working with a lot of tools in her profession and needing to keep items clean. ROM: OT providing demonstration and cues of each movement, encouraging slow and steady movements without jerky or increased pressure, focusing on "gliding" through movements.  Pt reports stiffness of thumb in thenar eminence during composite flexion of thumb.  OT providing demonstration to increase thumb opposition with "duck mouth".  OT  added C strength, with lowering thumb in towards the table as if grabbing a cup or making an "L". - Seated Digit Tendon Gliding  - "Duck mouth"   - Thumb Opposition   - Seated Thumb Composite Flexion AROM  - C- strength Massage: engaged in massage at thumb and into thenar eminence in circular, perpendicular, and parallel directions along area of pain.  OT encouraging attempts at self-massage and/or deep pressure as tolerated.    06/19/23 Splint assessment: pt brought in personal resting hand splints.  OT had pt don and then provided with written instructions on tightening strap at wrist and forearm to maintain positioning and decrease slipping of splint during wear or sleep.  OT reiterating recommendation to be able to get finger under strap to ensure not tightening too much.  OT having pt angle thumb strap and loose forearm strap to ensure increased fit of brace and reduce slipping. ROM: OT providing demonstration and cues of each movement, encouraging slow and steady movements without jerky or increased pressure, focusing on "gliding" through movements.  Pt reports some "clicking" or "catching" at base of thumb at MP joint with circumduction in clockwise manner on R. - Seated Digit Tendon Gliding  - "Duck mouth"   - Thumb Opposition   - Seated Thumb Composite Flexion AROM  - Seated Thumb Circumduction   Joint protection: OT educated on joint protection strategies and reviewing anatomy of wrist and hands as impacted with prolonged wrist flexion or extension, encouraging neutral positioning as able.  OT encouraging use of additional tools as needed and/or change in position of UE when opening containers.  OT provided pt with built up handles to aid in decreased pressure on writing and/or eating utensils.                                  PATIENT EDUCATION: Education details: see today's tx above Person educated: Patient Education method: Explanation Education comprehension: verbalized  understanding  HOME EXERCISE PROGRAM: Access Code: [email or web address]  URL: [email or web address] Date: 06/19/2023 Prepared by: [facility] - Outpatient  Rehab - [facility]  Exercises - Seated Digit Tendon Gliding  - 4 x daily - 5 reps - "Duck mouth"  - 4 x daily - 5 reps - Thumb Opposition  - 4 x daily - 5 reps - Seated Thumb Composite Flexion AROM  - 4 x daily - 5 reps - 5 hold - Seated Thumb Circumduction  - 4 x daily - 5 reps  GOALS: Goals reviewed with patient? Yes  SHORT TERM GOALS: Target date: 07/17/23  Pt will be ind with HEP using visual handouts. Baseline: new to outpt OT Goal status: IN PROGRESS  2.  Pt will ind recall at least 3 joint protection strategies. Baseline: new to outpt OT. Pt reporting hand pain with many ADL/IADL tasks. Goal status: IN PROGRESS  3.  Pt will verbalize understanding of sleep positioning strategies.  Baseline: Pt reported waking up and hands are numb. Goal status: IN PROGRESS  4.  Pt will verbalize understanding of splint wear/care schedule and purpose of splint.  Baseline: Pt reported pt has splint and sometimes wears at night to prevent wrist movement during sleep. Goal status: IN PROGRESS  5.  Pt will ind recall at least 3 edema management and/or pain management strategies. Baseline: Pt demo'ing pain and edema primarily affecting R thumb. Goal status: IN PROGRESS  LONG TERM GOALS: Target date: 08/14/23  Patient will demo improved FM coordination as evidenced by completing nine-hole peg with use of RUE in 22 seconds or less with no more than 1 drop. Baseline: 9 Hole Peg test: Right: 24 sec with x1 drop; Left: 22 sec with no drops. Goal status: IN PROGRESS  2.  Patient will demonstrate at least 31 lbs RUE grip strength as needed to open jars and other containers.  Baseline: Grip strength: Right: 27, 27, 28 (27.3 lbs average) lbs; Left: 36, 39, 37 (37.3 lbs average)  lbs Goal status: IN PROGRESS  3.  Patient will  demonstrate at least 16% improvement with quick Dash score (reporting 36.3% disability or less) indicating improved functional use of affected extremity.  Baseline: Quick Dash: 52.3% deficit Goal status: IN PROGRESS  4.  Pt will verbalize or demo understanding of A/E options and adaptive strategies for ADL/IADL tasks, including handwriting and work-related tasks, in order to decrease pain of RUE. Baseline: Pt reported pain during many functional tasks, including handwriting and many work-related tasks.  Goal status: IN PROGRESS   ASSESSMENT:  CLINICAL IMPRESSION: Pt tolerating tendon glides and thumb AROM this session with reports of tightness in thenar eminence.  Pt tolerating heat and massage as modalities to reduce pain and increase AROM.  Pt continues to report mild increase in pain with composite thumb flexion, therefore recommended hold at near end range to progress ROM and tolerance.  Pt would benefit from skilled OT services in the outpatient setting to work on impairments as noted below.  PERFORMANCE DEFICITS: in functional skills including ADLs, IADLs, coordination, dexterity, sensation, edema, strength, pain, flexibility, Fine motor control, Gross motor control, mobility, body mechanics, endurance, and UE functional use, cognitive skills including energy/drive, and psychosocial skills including environmental adaptation.     PLAN:  OT FREQUENCY: 2x/week  OT DURATION: 6 weeks  PLANNED INTERVENTIONS: 97168 OT Re-evaluation, 97535 self care/ADL training, 82956 therapeutic exercise, 97530 therapeutic activity, 97112 neuromuscular re-education, 97140 manual therapy, 97035 ultrasound, 97018 paraffin, 21308 fluidotherapy, 97760 Orthotic Initial, [id number] Prosthetic Initial, [id number] Orthotic/Prosthetic subsequent, passive range of motion, functional  mobility training, energy conservation, patient/family education, and DME and/or AE instructions  RECOMMENDED OTHER SERVICES: n/a  CONSULTED AND  AGREED WITH PLAN OF CARE: Patient  PLAN FOR NEXT SESSION:  REVIEW and add to PRN - HEP: Tendon glides and gentle thumb AROM Sleep positioning handout Joint protection handout - REVIEW PRN A/E and adaptive strategies for ADLs/IADLs (e.g. handwriting, see objective information above for details about specific activities) Assess splint fit - pt has over-the-counter splint and wears at night - did it fit better decreased pain/numbness in the morning? Edema management and pain management strategies (e.g. modalities)      Anthonette Kinsman, OTR/L 06/23/2023, 8:36 AM   [facility] at [facility] [address] [place], [address] [place], [place] [address] Phone # [phone] Fax # [phone]

## 2023-06-25 ENCOUNTER — Ambulatory Visit: Attending: Physician Assistant | Admitting: Occupational Therapy

## 2023-06-25 DIAGNOSIS — R208 Other disturbances of skin sensation: Secondary | ICD-10-CM | POA: Insufficient documentation

## 2023-06-25 DIAGNOSIS — M6281 Muscle weakness (generalized): Secondary | ICD-10-CM | POA: Insufficient documentation

## 2023-06-25 DIAGNOSIS — R278 Other lack of coordination: Secondary | ICD-10-CM | POA: Diagnosis present

## 2023-06-25 DIAGNOSIS — R29898 Other symptoms and signs involving the musculoskeletal system: Secondary | ICD-10-CM | POA: Diagnosis present

## 2023-06-25 NOTE — Therapy (Signed)
 OUTPATIENT OCCUPATIONAL THERAPY ORTHO  Treatment Note  Patient Name: Jill Melendez MRN: [id number] DOB:June 10, 1970, 53 y.o., female Today's Date: 06/25/2023  PCP: Trenton Frock, PA-C  REFERRING PROVIDER: Trenton Frock, PA-C   END OF SESSION:  OT End of Session - 06/25/23 0817     Visit Number 4    Number of Visits 13   including eval   Date for OT Re-Evaluation 08/14/23    Authorization Type [employer] required, VL: 30    OT Start Time 0804    OT Stop Time 0846    OT Time Calculation (min) 42 min    Activity Tolerance Patient tolerated treatment well    Behavior During Therapy [facility] for tasks assessed/performed                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   Influenza due to influenza virus, type B 04/12/2014   Migraine    Sepsis (HCC) 04/11/2014   Past Surgical History:  Procedure Laterality Date   BREAST EXCISIONAL BIOPSY Left 1994   BREAST SURGERY     COLONOSCOPY     SIGMOIDOSCOPY     some upper Gi test     pt states was not EGD   Patient Active Problem List   Diagnosis Date Noted   Non-alcoholic fatty liver disease 03/20/2023    ONSET DATE: 06/04/2023 (referral date)  REFERRING DIAG: M79.644,G89.29 (ICD-10-CM) - Chronic pain of right thumb   THERAPY DIAG:  Other lack of coordination  Muscle weakness (generalized)  Other symptoms and signs involving the musculoskeletal system  Other disturbances of skin sensation  Rationale for Evaluation and Treatment: Rehabilitation  SUBJECTIVE:   SUBJECTIVE STATEMENT: Goes by: Jill Melendez  Pt reports that she has been super busy so she wasn't able to try any of the new exercises, but has been focusing on the previous ones as she can remember them.  Pt reports having a full body pain flare on Tues that was really impacting her back and hips, she required  assistance from her husband to get out of the car.  Pt accompanied by: self  PERTINENT HISTORY: anxiety, hx of migraine/headache   Per 06/04/23 PA-C Progress Notes: "Chronic pain of right thumb... Refer to occupational therapy for hand strengthening and massage techniques."  PRECAUTIONS: None  RED FLAGS: None   WEIGHT BEARING RESTRICTIONS: No  PAIN:  Are you having pain? Yes: NPRS scale: 3/10 Pain location: R elbow to hand Pain description: stiff Aggravating factors: grasping with thumb: e.g. opening containers, turning a key Relieving factors: moving  FALLS: Has patient fallen in last 6 months? No  LIVING ENVIRONMENT: Lives with: lives with their family Lives in: House/apartment Stairs: Yes: Internal: 12 steps; can reach both and External: 5 steps; can reach both Has following equipment at home: Single point cane, Walker - 2 wheeled, Wheelchair (manual), Tour manager, and Grab bars  PLOF: Independent with basic ADLs, currently working as Advertising account planner and completing massages, currently driving  PATIENT GOALS: "to be able to learn to do things differently that won't put such stress on my hand"  NEXT MD VISIT: not yet scheduled  OBJECTIVE:  Note: Objective measures were completed at Evaluation unless otherwise noted.  HAND DOMINANCE: Right  ADLs: Eating: ind Grooming: ind, difficulty using hair dryer and hair styling tools d/t difficulty grasping objects Upper  body dressing: ind, difficulty with zippers/buttons Lower body dressing: ind, wears slip-on shoes, sometimes difficult to button jeans Bathing: no concerns  Handwriting: pt has adjusted grasp patten from standard tripod grip to modified tripod grip and other grip patterns d/t pain. Pt wrote simple sentence with standard tripod grip pattern with 100% legibility though OT noted pt demo'd tighter grasp as task progressed. Pt reported handwriting currently at 60% of current level of function.  Opening  jars/containers: difficult  Occupation: Advertising account planner and massage - pt reported repetitive tasks at work and has attempted changing position to avoid using thumb when completing massage which has now aggravated wrist. Pt reported pain when squeezing product out of tube containers and when using nail clippers.   FUNCTIONAL OUTCOME MEASURES: Quick Dash: 52.3% deficit    UPPER EXTREMITY ROM:     Active ROM Right eval Left eval  Shoulder flexion [facility]   Shoulder abduction    Shoulder adduction    Shoulder extension    Shoulder internal rotation    Shoulder external rotation    Elbow flexion    Elbow extension    Wrist flexion WFL - pt c/o pain    Wrist extension WFL- pt c/o pain when lifting objects when in wrist ext   Wrist ulnar deviation    Wrist radial deviation    Wrist pronation WFL - per pt "stiff"   Wrist supination WFL   (Blank rows = not tested)  Active ROM Right eval Left eval  Thumb MCP (0-60)    Thumb IP (0-80)    Thumb Radial abd/add (0-55)     Thumb Palmar abd/add (0-45)     Thumb Opposition to Small Finger     Index MCP (0-90)     Index PIP (0-100)     Index DIP (0-70)      Long MCP (0-90)      Long PIP (0-100)      Long DIP (0-70)      Ring MCP (0-90)      Ring PIP (0-100)      Ring DIP (0-70)      Little MCP (0-90)      Little PIP (0-100)      Little DIP (0-70)      (Blank rows = not tested)   UPPER EXTREMITY MMT:     MMT Right eval Left eval  Shoulder flexion    Shoulder abduction    Shoulder adduction    Shoulder extension    Shoulder internal rotation    Shoulder external rotation    Middle trapezius    Lower trapezius    Elbow flexion    Elbow extension    Wrist flexion    Wrist extension    Wrist ulnar deviation    Wrist radial deviation    Wrist pronation    Wrist supination    (Blank rows = not tested)  RUE: Digits 2-5 WFL flex/ext. Thumb IP joint - impaired. Pt reported thumb pain with thumb flex. Pt reported "stabbing  pain sometimes in center of anterior palm when grasping objects mainly in morning.  LUE: Digits 1-5 WFL flex/ext.  HAND FUNCTION: Grip strength: Right: 27, 27, 28 (27.3 lbs average) lbs; Left: 36, 39, 37 (37.3 lbs average)  lbs  Pt reported pain of R thumb when holding dynamometer. Pt questioning if "cyst" is present.  COORDINATION: 9 Hole Peg test: Right: 24 sec with x1 drop; Left: 22 sec with no drops.  Pt reported dropping items more  frequently with RUE "a couple times per day."   SENSATION: Pt reported numb when waking up this morning which happens frequently. Pt reported it can take 1-2 hours to resolve.   EDEMA:  Pt reported swelling at R thumb IP joint, sometimes wraps thumb using First Aid tape. Pt questioning presence of "cyst" at base of thumb near MCP joint on anterior side and OT noted more prominent bony feel at RUE location of discomfort compared to LUE.  COGNITION: Overall cognitive status: Within functional limits for tasks assessed  OBSERVATIONS: Pt was pleasant and ambulated ind without A/E.   TREATMENT DATE:       06/25/23 Heat: applied moist heat to R hand for 10 mins while engaging in discussion about splint wear and full body pain flare trumping thumb pain Tuesday afternoon.  Pt with concerns about autoimmune disease.  OT providing therapeutic listening.   Body mechanics: discussed positioning of body when at work to allow for improved body mechanics to allow for increased alignment and wrist in neutral position as able.  OT providing encouragement for positioning of her body and/or client positioning to allow for improvements in body mechanics.  Massage: engaged in massage at thumb and into thenar eminence in circular, perpendicular, and parallel directions along area of pain.  OT encouraging attempts at self-massage and/or deep pressure as tolerated.  Pt with nodule on R thumb at MP joint, reports it increases and decreases but unsure if there is an underlying  cause.   Pinch strength: focus on 3 jaw chuck and lateral pinch to carryover to functional pinch as needed for profession. Clothespins: utilized 2# and 4# resistance clothespins, placing on horizontal dowel in 3 jaw chuck and lateral pinch to simulate use of tweezers and/or nail clippers.  Pt reports mild discomfort with lateral pinch with 4# clothespin. Putty (yellow): engaging in full grasp, 3 jaw chuck, and lateral pinch with yellow theraputty.  OT providing instruction on rationale of use of putty for pinch strengthening and care when using putty.    06/23/23 Heat: applied moist heat to R hand for 10 mins while engaging in discussion about heat recommendations prior to exercises.  Pt reports understanding.  Provided with handout of rationale of heat and types of moist heat.   Self-care: Engaged in discussion of modifying strap placement on overnight resting hand splint, OT providing demonstration of placement and providing pt with additional piece of strapping to aid in securing hand splint as thumb continues to slide out.  Engaging in discussion of purchasing additional foam grip handles as pt working with a lot of tools in her profession and needing to keep items clean. ROM: OT providing demonstration and cues of each movement, encouraging slow and steady movements without jerky or increased pressure, focusing on "gliding" through movements.  Pt reports stiffness of thumb in thenar eminence during composite flexion of thumb.  OT providing demonstration to increase thumb opposition with "duck mouth".  OT added C strength, with lowering thumb in towards the table as if grabbing a cup or making an "L". - Seated Digit Tendon Gliding  - "Duck mouth"   - Thumb Opposition   - Seated Thumb Composite Flexion AROM  - C- strength Massage: engaged in massage at thumb and into thenar eminence in circular, perpendicular, and parallel directions along area of pain.  OT encouraging attempts at self-massage  and/or deep pressure as tolerated.    06/19/23 Splint assessment: pt brought in personal resting hand splints.  OT had pt don  and then provided with written instructions on tightening strap at wrist and forearm to maintain positioning and decrease slipping of splint during wear or sleep.  OT reiterating recommendation to be able to get finger under strap to ensure not tightening too much.  OT having pt angle thumb strap and loose forearm strap to ensure increased fit of brace and reduce slipping. ROM: OT providing demonstration and cues of each movement, encouraging slow and steady movements without jerky or increased pressure, focusing on "gliding" through movements.  Pt reports some "clicking" or "catching" at base of thumb at MP joint with circumduction in clockwise manner on R. - Seated Digit Tendon Gliding  - "Duck mouth"   - Thumb Opposition   - Seated Thumb Composite Flexion AROM  - Seated Thumb Circumduction   Joint protection: OT educated on joint protection strategies and reviewing anatomy of wrist and hands as impacted with prolonged wrist flexion or extension, encouraging neutral positioning as able.  OT encouraging use of additional tools as needed and/or change in position of UE when opening containers.  OT provided pt with built up handles to aid in decreased pressure on writing and/or eating utensils.                                  PATIENT EDUCATION: Education details: see today's tx above Person educated: Patient Education method: Explanation Education comprehension: verbalized understanding  HOME EXERCISE PROGRAM: Access Code: [email or web address] URL: [email or web address] Date: 06/19/2023 Prepared by: [facility] - Outpatient  Rehab - [facility]  Exercises - Seated Digit Tendon Gliding  - 4 x daily - 5 reps - "Duck mouth"  - 4 x daily - 5 reps - Thumb Opposition  - 4 x daily - 5 reps - Seated Thumb Composite Flexion AROM  - 4 x daily - 5 reps - 5 hold -  Seated Thumb Circumduction  - 4 x daily - 5 reps  GOALS: Goals reviewed with patient? Yes  SHORT TERM GOALS: Target date: 07/17/23  Pt will be ind with HEP using visual handouts. Baseline: new to outpt OT Goal status: IN PROGRESS  2.  Pt will ind recall at least 3 joint protection strategies. Baseline: new to outpt OT. Pt reporting hand pain with many ADL/IADL tasks. Goal status: IN PROGRESS  3.  Pt will verbalize understanding of sleep positioning strategies.  Baseline: Pt reported waking up and hands are numb. Goal status: IN PROGRESS  4.  Pt will verbalize understanding of splint wear/care schedule and purpose of splint.  Baseline: Pt reported pt has splint and sometimes wears at night to prevent wrist movement during sleep. Goal status: IN PROGRESS  5.  Pt will ind recall at least 3 edema management and/or pain management strategies. Baseline: Pt demo'ing pain and edema primarily affecting R thumb. Goal status: IN PROGRESS  LONG TERM GOALS: Target date: 08/14/23  Patient will demo improved FM coordination as evidenced by completing nine-hole peg with use of RUE in 22 seconds or less with no more than 1 drop. Baseline: 9 Hole Peg test: Right: 24 sec with x1 drop; Left: 22 sec with no drops. Goal status: IN PROGRESS  2.  Patient will demonstrate at least 31 lbs RUE grip strength as needed to open jars and other containers.  Baseline: Grip strength: Right: 27, 27, 28 (27.3 lbs average) lbs; Left: 36, 39, 37 (37.3 lbs average)  lbs Goal status: IN PROGRESS  3.  Patient will demonstrate at least 16% improvement with quick Dash score (reporting 36.3% disability or less) indicating improved functional use of affected extremity.  Baseline: Quick Dash: 52.3% deficit Goal status: IN PROGRESS  4.  Pt will verbalize or demo understanding of A/E options and adaptive strategies for ADL/IADL tasks, including handwriting and work-related tasks, in order to decrease pain of RUE. Baseline:  Pt reported pain during many functional tasks, including handwriting and many work-related tasks.  Goal status: IN PROGRESS   ASSESSMENT:  CLINICAL IMPRESSION: Pt tolerating functional pinch with increased resistance with theraputty and resistance clothespins this session with reports of mild pain in R thumb with increased resistance with clothespins with lateral pinch.  Pt tolerating heat and massage as modalities to reduce pain and increase AROM.  Pt with upcoming labs to further assess comorbidities and evaluation for underlying concerns.  Pt would benefit from skilled OT services in the outpatient setting to work on impairments as noted below.  PERFORMANCE DEFICITS: in functional skills including ADLs, IADLs, coordination, dexterity, sensation, edema, strength, pain, flexibility, Fine motor control, Gross motor control, mobility, body mechanics, endurance, and UE functional use, cognitive skills including energy/drive, and psychosocial skills including environmental adaptation.     PLAN:  OT FREQUENCY: 2x/week  OT DURATION: 6 weeks  PLANNED INTERVENTIONS: 97168 OT Re-evaluation, 97535 self care/ADL training, 09811 therapeutic exercise, 97530 therapeutic activity, 97112 neuromuscular re-education, 97140 manual therapy, 97035 ultrasound, 97018 paraffin, 91478 fluidotherapy, 97760 Orthotic Initial, 97761 Prosthetic Initial, 97763 Orthotic/Prosthetic subsequent, passive range of motion, functional mobility training, energy conservation, patient/family education, and DME and/or AE instructions  RECOMMENDED OTHER SERVICES: n/a  CONSULTED AND AGREED WITH PLAN OF CARE: Patient  PLAN FOR NEXT SESSION:  REVIEW and add to PRN - HEP: Tendon glides and gentle thumb AROM Sleep positioning handout Joint protection handout - REVIEW PRN A/E and adaptive strategies for ADLs/IADLs (e.g. handwriting, see objective information above for details about specific activities) Assess splint fit - pt has  over-the-counter splint and wears at night - did it fit better decreased pain/numbness in the morning? Edema management and pain management strategies (e.g. modalities)      Jadden Yim, OTR/L 06/25/2023, 11:23 AM   [facility] at [facility] [address] [place], [address] [place], [place] [address] Phone # [phone] Fax # [phone]

## 2023-06-26 ENCOUNTER — Ambulatory Visit (HOSPITAL_COMMUNITY)

## 2023-06-26 ENCOUNTER — Ambulatory Visit (HOSPITAL_COMMUNITY)
Admission: RE | Admit: 2023-06-26 | Discharge: 2023-06-26 | Disposition: A | Discharge status: Home / Self Care | Source: Ambulatory Visit | Attending: Internal Medicine | Admitting: Internal Medicine

## 2023-06-26 ENCOUNTER — Other Ambulatory Visit: Payer: Self-pay | Admitting: Internal Medicine

## 2023-06-26 DIAGNOSIS — K838 Other specified diseases of biliary tract: Secondary | ICD-10-CM | POA: Insufficient documentation

## 2023-06-26 DIAGNOSIS — R1011 Right upper quadrant pain: Secondary | ICD-10-CM | POA: Diagnosis present

## 2023-06-26 MED ORDER — GADOBUTROL 1 MMOL/ML IV SOLN
6.0000 mL | Freq: Once | INTRAVENOUS | Status: AC | PRN
  Administered 2023-06-26: 6 mL via INTRAVENOUS

## 2023-06-30 ENCOUNTER — Ambulatory Visit: Admitting: Occupational Therapy

## 2023-06-30 ENCOUNTER — Other Ambulatory Visit: Payer: Self-pay

## 2023-06-30 DIAGNOSIS — R278 Other lack of coordination: Secondary | ICD-10-CM | POA: Diagnosis not present

## 2023-06-30 DIAGNOSIS — M6281 Muscle weakness (generalized): Secondary | ICD-10-CM

## 2023-06-30 DIAGNOSIS — R29898 Other symptoms and signs involving the musculoskeletal system: Secondary | ICD-10-CM

## 2023-06-30 DIAGNOSIS — R208 Other disturbances of skin sensation: Secondary | ICD-10-CM

## 2023-06-30 MED ORDER — NORTRIPTYLINE HCL 10 MG PO CAPS: 10.0000 mg | ORAL_CAPSULE | Freq: Every day | ORAL | 3 refills | Status: DC

## 2023-06-30 NOTE — Therapy (Signed)
 OUTPATIENT OCCUPATIONAL THERAPY ORTHO  Treatment Note  Patient Name: Jill Melendez MRN: [id number] DOB:[birth year], 53 y.o., female Today's Date: 06/30/2023  PCP: Trenton Frock, PA-C  REFERRING PROVIDER: Trenton Frock, PA-C   END OF SESSION:  OT End of Session - 06/30/23 1116     Visit Number 5    Number of Visits 13   including eval   Date for OT Re-Evaluation 08/14/23    Authorization Type [employer] required, VL: 30    OT Start Time 1106   pt arrival time   OT Stop Time 1146    OT Time Calculation (min) 40 min    Activity Tolerance Patient tolerated treatment well    Behavior During Therapy WFL for tasks assessed/performed                 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   Influenza due to influenza virus, type B 04/12/2014   Migraine    Sepsis (HCC) 04/11/2014   Past Surgical History:  Procedure Laterality Date   BREAST EXCISIONAL BIOPSY Left 1994   BREAST SURGERY     COLONOSCOPY     SIGMOIDOSCOPY     some upper Gi test     pt states was not EGD   Patient Active Problem List   Diagnosis Date Noted   Non-alcoholic fatty liver disease 03/20/2023    ONSET DATE: 06/04/2023 (referral date)  REFERRING DIAG: M79.644,G89.29 (ICD-10-CM) - Chronic pain of right thumb   THERAPY DIAG:  Other lack of coordination  Muscle weakness (generalized)  Other symptoms and signs involving the musculoskeletal system  Other disturbances of skin sensation  Rationale for Evaluation and Treatment: Rehabilitation  SUBJECTIVE:   SUBJECTIVE STATEMENT: Goes by: Jill Melendez  Pt reports getting results back this morning, they located a cyst on her liver and elevated numbers and MD thinks she may have IBS.   Pt reports feeling pretty miserable after undergoing scans with contrast, with increased swelling and pain in hands.     Pt accompanied by: self  PERTINENT HISTORY: anxiety, hx of migraine/headache   Per 06/04/23 PA-C Progress Notes: "Chronic pain of right thumb... Refer to occupational therapy for hand strengthening and massage techniques."  PRECAUTIONS: None  RED FLAGS: None   WEIGHT BEARING RESTRICTIONS: No  PAIN:  Are you having pain? Yes: NPRS scale: 6-7/10 Pain location: R hand, particularly with thumb opposition and composite flexion Pain description: stiff Aggravating factors: grasping with thumb: e.g. opening containers, turning a key Relieving factors: moving  FALLS: Has patient fallen in last 6 months? No  LIVING ENVIRONMENT: Lives with: lives with their family Lives in: House/apartment Stairs: Yes: Internal: 12 steps; can reach both and External: 5 steps; can reach both Has following equipment at home: Single point cane, Walker - 2 wheeled, Wheelchair (manual), Tour manager, and Grab bars  PLOF: Independent with basic ADLs, currently working as Advertising account planner and completing massages, currently driving  PATIENT GOALS: "to be able to learn to do things differently that won't put such stress on my hand"  NEXT MD VISIT: not yet scheduled  OBJECTIVE:  Note: Objective measures were completed at Evaluation unless otherwise noted.  HAND DOMINANCE: Right  ADLs: Eating: ind Grooming: ind, difficulty using hair dryer and hair styling tools d/t difficulty grasping objects Upper body dressing: ind, difficulty with zippers/buttons Lower body  dressing: ind, wears slip-on shoes, sometimes difficult to button jeans Bathing: no concerns  Handwriting: pt has adjusted grasp patten from standard tripod grip to modified tripod grip and other grip patterns d/t pain. Pt wrote simple sentence with standard tripod grip pattern with 100% legibility though OT noted pt demo'd tighter grasp as task progressed. Pt reported handwriting currently at 60% of current level of function.  Opening  jars/containers: difficult  Occupation: Advertising account planner and massage - pt reported repetitive tasks at work and has attempted changing position to avoid using thumb when completing massage which has now aggravated wrist. Pt reported pain when squeezing product out of tube containers and when using nail clippers.   FUNCTIONAL OUTCOME MEASURES: Quick Dash: 52.3% deficit    UPPER EXTREMITY ROM:     Active ROM Right eval Left eval  Shoulder flexion [facility]   Shoulder abduction    Shoulder adduction    Shoulder extension    Shoulder internal rotation    Shoulder external rotation    Elbow flexion    Elbow extension    Wrist flexion WFL - pt c/o pain    Wrist extension WFL- pt c/o pain when lifting objects when in wrist ext   Wrist ulnar deviation    Wrist radial deviation    Wrist pronation WFL - per pt "stiff"   Wrist supination WFL   (Blank rows = not tested)  Active ROM Right eval Left eval  Thumb MCP (0-60)    Thumb IP (0-80)    Thumb Radial abd/add (0-55)     Thumb Palmar abd/add (0-45)     Thumb Opposition to Small Finger     Index MCP (0-90)     Index PIP (0-100)     Index DIP (0-70)      Long MCP (0-90)      Long PIP (0-100)      Long DIP (0-70)      Ring MCP (0-90)      Ring PIP (0-100)      Ring DIP (0-70)      Little MCP (0-90)      Little PIP (0-100)      Little DIP (0-70)      (Blank rows = not tested)   UPPER EXTREMITY MMT:     MMT Right eval Left eval  Shoulder flexion    Shoulder abduction    Shoulder adduction    Shoulder extension    Shoulder internal rotation    Shoulder external rotation    Middle trapezius    Lower trapezius    Elbow flexion    Elbow extension    Wrist flexion    Wrist extension    Wrist ulnar deviation    Wrist radial deviation    Wrist pronation    Wrist supination    (Blank rows = not tested)  RUE: Digits 2-5 WFL flex/ext. Thumb IP joint - impaired. Pt reported thumb pain with thumb flex. Pt reported "stabbing  pain sometimes in center of anterior palm when grasping objects mainly in morning.  LUE: Digits 1-5 WFL flex/ext.  HAND FUNCTION: Grip strength: Right: 27, 27, 28 (27.3 lbs average) lbs; Left: 36, 39, 37 (37.3 lbs average)  lbs  Pt reported pain of R thumb when holding dynamometer. Pt questioning if "cyst" is present.  COORDINATION: 9 Hole Peg test: Right: 24 sec with x1 drop; Left: 22 sec with no drops.  Pt reported dropping items more frequently with RUE "a couple times per day."  SENSATION: Pt reported numb when waking up this morning which happens frequently. Pt reported it can take 1-2 hours to resolve.   EDEMA:  Pt reported swelling at R thumb IP joint, sometimes wraps thumb using First Aid tape. Pt questioning presence of "cyst" at base of thumb near MCP joint on anterior side and OT noted more prominent bony feel at RUE location of discomfort compared to LUE.  COGNITION: Overall cognitive status: Within functional limits for tasks assessed  OBSERVATIONS: Pt was pleasant and ambulated ind without A/E.   TREATMENT DATE:       06/30/23 flexbar: engaged in grip strengthening and wrist flexion/extension with (red) flexbar with focus on hand positioning with thumb in opposition vs thumb next to index finger. OT providing education on not overdoing it with thumb opposition while also still encouraging pt to attempt positioning to continue to maintain good hand contours and strength. Pegs: engaged in placing and removing large grip pegs from resistive peg board with focus on pinch, grip, and in-hand manipulation with addition of rotation of pegs in finger tips.  Pt reports tingling in long and ring finger as task continued.  OT assessed positioning and recognized pt with increased wrist flexion and ulnar deviation, therefore recommending pt start across body and move towards R side to allow for improved postural alignment and not having to reach over pegs resulting in increased flexion  and deviation.  Pt verbalized decreased strain working L > R. Putty: engaged in 3 jaw chuck and lateral pinch with yellow theraputty.  OT providing min verbal cues for technique with lateral pinch.  OT also repositioning putty towards midline to decrease overuse of shoulder and elbow and reiterating education on body mechanics and positioning to facilitate increased alignment.  Pt verbalizing understanding.  Engaged in pinch and pull with putty and then rolling into small balls with thumb, index, and long fingers.  Pt reports increased fatigue and onset of numbness in fingers when rolling putty into ball.    06/25/23 Heat: applied moist heat to R hand for 10 mins while engaging in discussion about splint wear and full body pain flare trumping thumb pain Tuesday afternoon.  Pt with concerns about autoimmune disease.  OT providing therapeutic listening.   Body mechanics: discussed positioning of body when at work to allow for improved body mechanics to allow for increased alignment and wrist in neutral position as able.  OT providing encouragement for positioning of her body and/or client positioning to allow for improvements in body mechanics.  Massage: engaged in massage at thumb and into thenar eminence in circular, perpendicular, and parallel directions along area of pain.  OT encouraging attempts at self-massage and/or deep pressure as tolerated.  Pt with nodule on R thumb at MP joint, reports it increases and decreases but unsure if there is an underlying cause.   Pinch strength: focus on 3 jaw chuck and lateral pinch to carryover to functional pinch as needed for profession. Clothespins: utilized 2# and 4# resistance clothespins, placing on horizontal dowel in 3 jaw chuck and lateral pinch to simulate use of tweezers and/or nail clippers.  Pt reports mild discomfort with lateral pinch with 4# clothespin. Putty (yellow): engaging in full grasp, 3 jaw chuck, and lateral pinch with yellow theraputty.  OT  providing instruction on rationale of use of putty for pinch strengthening and care when using putty.    06/23/23 Heat: applied moist heat to R hand for 10 mins while engaging in discussion about heat recommendations prior  to exercises.  Pt reports understanding.  Provided with handout of rationale of heat and types of moist heat.   Self-care: Engaged in discussion of modifying strap placement on overnight resting hand splint, OT providing demonstration of placement and providing pt with additional piece of strapping to aid in securing hand splint as thumb continues to slide out.  Engaging in discussion of purchasing additional foam grip handles as pt working with a lot of tools in her profession and needing to keep items clean. ROM: OT providing demonstration and cues of each movement, encouraging slow and steady movements without jerky or increased pressure, focusing on "gliding" through movements.  Pt reports stiffness of thumb in thenar eminence during composite flexion of thumb.  OT providing demonstration to increase thumb opposition with "duck mouth".  OT added C strength, with lowering thumb in towards the table as if grabbing a cup or making an "L". - Seated Digit Tendon Gliding  - "Duck mouth"   - Thumb Opposition   - Seated Thumb Composite Flexion AROM  - C- strength Massage: engaged in massage at thumb and into thenar eminence in circular, perpendicular, and parallel directions along area of pain.  OT encouraging attempts at self-massage and/or deep pressure as tolerated.    PATIENT EDUCATION: Education details: see today's tx above Person educated: Patient Education method: Explanation Education comprehension: verbalized understanding  HOME EXERCISE PROGRAM: Access Code: [email or web address] URL: [email or web address] Date: 06/19/2023 Prepared by: [facility] - Outpatient  Rehab - [facility]  Exercises - Seated Digit Tendon Gliding  - 4 x daily - 5 reps - "Duck mouth"   - 4 x daily - 5 reps - Thumb Opposition  - 4 x daily - 5 reps - Seated Thumb Composite Flexion AROM  - 4 x daily - 5 reps - 5 hold - Seated Thumb Circumduction  - 4 x daily - 5 reps  GOALS: Goals reviewed with patient? Yes  SHORT TERM GOALS: Target date: 07/17/23  Pt will be ind with HEP using visual handouts. Baseline: new to outpt OT Goal status: IN PROGRESS  2.  Pt will ind recall at least 3 joint protection strategies. Baseline: new to outpt OT. Pt reporting hand pain with many ADL/IADL tasks. Goal status: IN PROGRESS  3.  Pt will verbalize understanding of sleep positioning strategies.  Baseline: Pt reported waking up and hands are numb. Goal status: IN PROGRESS  4.  Pt will verbalize understanding of splint wear/care schedule and purpose of splint.  Baseline: Pt reported pt has splint and sometimes wears at night to prevent wrist movement during sleep. Goal status: IN PROGRESS  5.  Pt will ind recall at least 3 edema management and/or pain management strategies. Baseline: Pt demo'ing pain and edema primarily affecting R thumb. Goal status: IN PROGRESS  LONG TERM GOALS: Target date: 08/14/23  Patient will demo improved FM coordination as evidenced by completing nine-hole peg with use of RUE in 22 seconds or less with no more than 1 drop. Baseline: 9 Hole Peg test: Right: 24 sec with x1 drop; Left: 22 sec with no drops. Goal status: IN PROGRESS  2.  Patient will demonstrate at least 31 lbs RUE grip strength as needed to open jars and other containers.  Baseline: Grip strength: Right: 27, 27, 28 (27.3 lbs average) lbs; Left: 36, 39, 37 (37.3 lbs average)  lbs Goal status: IN PROGRESS  3.  Patient will demonstrate at least 16% improvement with quick Dash score (reporting 36.3% disability or less) indicating  improved functional use of affected extremity.  Baseline: Quick Dash: 52.3% deficit Goal status: IN PROGRESS  4.  Pt will verbalize or demo understanding of A/E  options and adaptive strategies for ADL/IADL tasks, including handwriting and work-related tasks, in order to decrease pain of RUE. Baseline: Pt reported pain during many functional tasks, including handwriting and many work-related tasks.  Goal status: IN PROGRESS   ASSESSMENT:  CLINICAL IMPRESSION: Pt verbalizing understanding of joint protection strategies and body positioning, however continues to benefit from verbal cues, education, and repositioning to increase success with therapeutic tasks.  Pt tolerating tasks against min resistance with pegs and yellow putty with cues for body positioning and modification of sequencing to allow for increased awareness of body and joint protection strategies.  Pt still with intermittent fatigue with pegs and increased fatigue with putty, especially when attempting in-hand manipulation with rolling putty into ball in thumb and first two fingers.  Pt would benefit from skilled OT services in the outpatient setting to work on impairments as noted below.  PERFORMANCE DEFICITS: in functional skills including ADLs, IADLs, coordination, dexterity, sensation, edema, strength, pain, flexibility, Fine motor control, Gross motor control, mobility, body mechanics, endurance, and UE functional use, cognitive skills including energy/drive, and psychosocial skills including environmental adaptation.     PLAN:  OT FREQUENCY: 2x/week  OT DURATION: 6 weeks  PLANNED INTERVENTIONS: 97168 OT Re-evaluation, 97535 self care/ADL training, 16109 therapeutic exercise, 97530 therapeutic activity, 97112 neuromuscular re-education, 97140 manual therapy, 97035 ultrasound, 97018 paraffin, 60454 fluidotherapy, 97760 Orthotic Initial, 97761 Prosthetic Initial, 97763 Orthotic/Prosthetic subsequent, passive range of motion, functional mobility training, energy conservation, patient/family education, and DME and/or AE instructions  RECOMMENDED OTHER SERVICES: n/a  CONSULTED AND AGREED  WITH PLAN OF CARE: Patient  PLAN FOR NEXT SESSION:  REVIEW and add to PRN - HEP: Tendon glides and gentle thumb AROM Sleep positioning handout Joint protection handout - REVIEW PRN A/E and adaptive strategies for ADLs/IADLs (e.g. handwriting, see objective information above for details about specific activities) Assess splint fit - pt has over-the-counter splint and wears at night - did it fit better decreased pain/numbness in the morning? Edema management and pain management strategies (e.g. modalities)      Birgitta Uhlir, OTR/L 06/30/2023, 11:16 AM   [facility] at [facility] [address] [place], [address] [place], [place] [address] Phone # [phone] Fax # [phone]

## 2023-07-02 ENCOUNTER — Ambulatory Visit: Admitting: Occupational Therapy

## 2023-07-02 ENCOUNTER — Encounter: Payer: Self-pay | Admitting: Internal Medicine

## 2023-07-03 ENCOUNTER — Ambulatory Visit: Admitting: Occupational Therapy

## 2023-07-03 DIAGNOSIS — R278 Other lack of coordination: Secondary | ICD-10-CM

## 2023-07-03 DIAGNOSIS — R29898 Other symptoms and signs involving the musculoskeletal system: Secondary | ICD-10-CM

## 2023-07-03 DIAGNOSIS — M6281 Muscle weakness (generalized): Secondary | ICD-10-CM

## 2023-07-03 DIAGNOSIS — R208 Other disturbances of skin sensation: Secondary | ICD-10-CM

## 2023-07-03 NOTE — Therapy (Signed)
 OUTPATIENT OCCUPATIONAL THERAPY ORTHO  Treatment Note  Patient Name: Jill Melendez MRN: [id number] DOB:[birth year], 53 y.o., female Today's Date: 07/03/2023  PCP: Trenton Frock, PA-C  REFERRING PROVIDER: Trenton Frock, PA-C   END OF SESSION:  OT End of Session - 07/03/23 0809     Visit Number 6    Number of Visits 13   including eval   Date for OT Re-Evaluation 08/14/23    Authorization Type [employer] required, VL: 30    OT Start Time 0802    OT Stop Time 0842    OT Time Calculation (min) 40 min    Activity Tolerance Patient tolerated treatment well    Behavior During Therapy [facility] for tasks assessed/performed                  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   Influenza due to influenza virus, type B 04/12/2014   Migraine    Sepsis (HCC) 04/11/2014   Past Surgical History:  Procedure Laterality Date   BREAST EXCISIONAL BIOPSY Left 1994   BREAST SURGERY     COLONOSCOPY     SIGMOIDOSCOPY     some upper Gi test     pt states was not EGD   Patient Active Problem List   Diagnosis Date Noted   Non-alcoholic fatty liver disease 03/20/2023    ONSET DATE: 06/04/2023 (referral date)  REFERRING DIAG: M79.644,G89.29 (ICD-10-CM) - Chronic pain of right thumb   THERAPY DIAG:  Other lack of coordination  Muscle weakness (generalized)  Other symptoms and signs involving the musculoskeletal system  Other disturbances of skin sensation  Rationale for Evaluation and Treatment: Rehabilitation  SUBJECTIVE:   SUBJECTIVE STATEMENT: Goes by: Jill Melendez  Pt reports noticing inflammation subsiding and having some decreased pain.  Pt reports that she is able to tell when the brace is on "just right" for night time.    Pt accompanied by: self  PERTINENT HISTORY: anxiety, hx of migraine/headache   Per 06/04/23 PA-C  Progress Notes: "Chronic pain of right thumb... Refer to occupational therapy for hand strengthening and massage techniques."  PRECAUTIONS: None  RED FLAGS: None   WEIGHT BEARING RESTRICTIONS: No  PAIN:  Are you having pain? Yes: NPRS scale: 1-2/10 Pain location: R hand, particularly with thumb opposition and composite flexion Pain description: stiff Aggravating factors: grasping with thumb: e.g. opening containers, turning a key Relieving factors: moving  FALLS: Has patient fallen in last 6 months? No  LIVING ENVIRONMENT: Lives with: lives with their family Lives in: House/apartment Stairs: Yes: Internal: 12 steps; can reach both and External: 5 steps; can reach both Has following equipment at home: Single point cane, Walker - 2 wheeled, Wheelchair (manual), Tour manager, and Grab bars  PLOF: Independent with basic ADLs, currently working as Advertising account planner and completing massages, currently driving  PATIENT GOALS: "to be able to learn to do things differently that won't put such stress on my hand"  NEXT MD VISIT: not yet scheduled  OBJECTIVE:  Note: Objective measures were completed at Evaluation unless otherwise noted.  HAND DOMINANCE: Right  ADLs: Eating: ind Grooming: ind, difficulty using hair dryer and hair styling tools d/t difficulty grasping objects Upper body dressing: ind, difficulty with zippers/buttons Lower body dressing: ind, wears slip-on shoes, sometimes difficult to button jeans Bathing: no concerns  Handwriting: pt has  adjusted grasp patten from standard tripod grip to modified tripod grip and other grip patterns d/t pain. Pt wrote simple sentence with standard tripod grip pattern with 100% legibility though OT noted pt demo'd tighter grasp as task progressed. Pt reported handwriting currently at 60% of current level of function.  Opening jars/containers: difficult  Occupation: Advertising account planner and massage - pt reported repetitive tasks at work and has  attempted changing position to avoid using thumb when completing massage which has now aggravated wrist. Pt reported pain when squeezing product out of tube containers and when using nail clippers.   FUNCTIONAL OUTCOME MEASURES: Quick Dash: 52.3% deficit    UPPER EXTREMITY ROM:     Active ROM Right eval Left eval  Shoulder flexion [facility]   Shoulder abduction    Shoulder adduction    Shoulder extension    Shoulder internal rotation    Shoulder external rotation    Elbow flexion    Elbow extension    Wrist flexion WFL - pt c/o pain    Wrist extension WFL- pt c/o pain when lifting objects when in wrist ext   Wrist ulnar deviation    Wrist radial deviation    Wrist pronation WFL - per pt "stiff"   Wrist supination WFL   (Blank rows = not tested)  Active ROM Right eval Left eval  Thumb MCP (0-60)    Thumb IP (0-80)    Thumb Radial abd/add (0-55)     Thumb Palmar abd/add (0-45)     Thumb Opposition to Small Finger     Index MCP (0-90)     Index PIP (0-100)     Index DIP (0-70)      Long MCP (0-90)      Long PIP (0-100)      Long DIP (0-70)      Ring MCP (0-90)      Ring PIP (0-100)      Ring DIP (0-70)      Little MCP (0-90)      Little PIP (0-100)      Little DIP (0-70)      (Blank rows = not tested)   UPPER EXTREMITY MMT:     MMT Right eval Left eval  Shoulder flexion    Shoulder abduction    Shoulder adduction    Shoulder extension    Shoulder internal rotation    Shoulder external rotation    Middle trapezius    Lower trapezius    Elbow flexion    Elbow extension    Wrist flexion    Wrist extension    Wrist ulnar deviation    Wrist radial deviation    Wrist pronation    Wrist supination    (Blank rows = not tested)  RUE: Digits 2-5 WFL flex/ext. Thumb IP joint - impaired. Pt reported thumb pain with thumb flex. Pt reported "stabbing pain sometimes in center of anterior palm when grasping objects mainly in morning.  LUE: Digits 1-5 WFL  flex/ext.  HAND FUNCTION: Grip strength: Right: 27, 27, 28 (27.3 lbs average) lbs; Left: 36, 39, 37 (37.3 lbs average)  lbs  Pt reported pain of R thumb when holding dynamometer. Pt questioning if "cyst" is present.  COORDINATION: 9 Hole Peg test: Right: 24 sec with x1 drop; Left: 22 sec with no drops.  Pt reported dropping items more frequently with RUE "a couple times per day."   SENSATION: Pt reported numb when waking up this morning which happens frequently. Pt reported it  can take 1-2 hours to resolve.   EDEMA:  Pt reported swelling at R thumb IP joint, sometimes wraps thumb using First Aid tape. Pt questioning presence of "cyst" at base of thumb near MCP joint on anterior side and OT noted more prominent bony feel at RUE location of discomfort compared to LUE.  COGNITION: Overall cognitive status: Within functional limits for tasks assessed  OBSERVATIONS: Pt was pleasant and ambulated ind without A/E.   TREATMENT DATE:       07/03/23 Modalities: reiterated education on use of heat modality for pain management and ROM.  Pt reports running her hands under warm water first thing in the morning to loosen hands up prior to brushing teeth and utilizing warm packs with her clients at work.   Sleep: pt reports that she is sleeping better at night, she does recognize some stiffness due to wearing R hand splint but is not experiencing any numbness when waking.  Thumb ROM: AROM: engaged in thumb opposition, blocked flexion at IP joint, MP joint, and CMC joint, circumduction clockwise and counter-clockwise, thumb abduction and thumb extension.  OT providing demonstration and cues for technique, adding additional cues/clarification for each on handout.  Pt completed each exercise x8.   PROM: engaged in passive flexion at IP joint, MP joint, and CMC joint, as well as abduction and composite flexion and extension.  Pt completing each x5 with focus on maintaining sustained hold 10 seconds. Wrist  ROM: engaged in wrist flexion and extension with focus on AROM, followed by PROM x5 each.  OT providing demonstration and cues for technique.    06/30/23 flexbar: engaged in grip strengthening and wrist flexion/extension with (red) flexbar with focus on hand positioning with thumb in opposition vs thumb next to index finger. OT providing education on not overdoing it with thumb opposition while also still encouraging pt to attempt positioning to continue to maintain good hand contours and strength. Pegs: engaged in placing and removing large grip pegs from resistive peg board with focus on pinch, grip, and in-hand manipulation with addition of rotation of pegs in finger tips.  Pt reports tingling in long and ring finger as task continued.  OT assessed positioning and recognized pt with increased wrist flexion and ulnar deviation, therefore recommending pt start across body and move towards R side to allow for improved postural alignment and not having to reach over pegs resulting in increased flexion and deviation.  Pt verbalized decreased strain working L > R. Putty: engaged in 3 jaw chuck and lateral pinch with yellow theraputty.  OT providing min verbal cues for technique with lateral pinch.  OT also repositioning putty towards midline to decrease overuse of shoulder and elbow and reiterating education on body mechanics and positioning to facilitate increased alignment.  Pt verbalizing understanding.  Engaged in pinch and pull with putty and then rolling into small balls with thumb, index, and long fingers.  Pt reports increased fatigue and onset of numbness in fingers when rolling putty into ball.    06/25/23 Heat: applied moist heat to R hand for 10 mins while engaging in discussion about splint wear and full body pain flare trumping thumb pain Tuesday afternoon.  Pt with concerns about autoimmune disease.  OT providing therapeutic listening.   Body mechanics: discussed positioning of body when at work  to allow for improved body mechanics to allow for increased alignment and wrist in neutral position as able.  OT providing encouragement for positioning of her body and/or client positioning to  allow for improvements in body mechanics.  Massage: engaged in massage at thumb and into thenar eminence in circular, perpendicular, and parallel directions along area of pain.  OT encouraging attempts at self-massage and/or deep pressure as tolerated.  Pt with nodule on R thumb at MP joint, reports it increases and decreases but unsure if there is an underlying cause.   Pinch strength: focus on 3 jaw chuck and lateral pinch to carryover to functional pinch as needed for profession. Clothespins: utilized 2# and 4# resistance clothespins, placing on horizontal dowel in 3 jaw chuck and lateral pinch to simulate use of tweezers and/or nail clippers.  Pt reports mild discomfort with lateral pinch with 4# clothespin. Putty (yellow): engaging in full grasp, 3 jaw chuck, and lateral pinch with yellow theraputty.  OT providing instruction on rationale of use of putty for pinch strengthening and care when using putty.    PATIENT EDUCATION: Education details: see today's tx above Person educated: Patient Education method: Explanation Education comprehension: verbalized understanding  HOME EXERCISE PROGRAM: Access Code: [email or web address] URL: [email or web address] Date: 06/19/2023 Prepared by: [facility] - Outpatient  Rehab - [facility]  Exercises - Seated Digit Tendon Gliding  - 4 x daily - 5 reps - "Duck mouth"  - 4 x daily - 5 reps - Thumb Opposition  - 4 x daily - 5 reps - Seated Thumb Composite Flexion AROM  - 4 x daily - 5 reps - 5 hold - Seated Thumb Circumduction  - 4 x daily - 5 reps  GOALS: Goals reviewed with patient? Yes  SHORT TERM GOALS: Target date: 07/17/23  Pt will be ind with HEP using visual handouts. Baseline: new to outpt OT Goal status: IN PROGRESS  2.  Pt will ind  recall at least 3 joint protection strategies. Baseline: new to outpt OT. Pt reporting hand pain with many ADL/IADL tasks. Goal status: IN PROGRESS  3.  Pt will verbalize understanding of sleep positioning strategies.  Baseline: Pt reported waking up and hands are numb. Goal status: IN PROGRESS  4.  Pt will verbalize understanding of splint wear/care schedule and purpose of splint.  Baseline: Pt reported pt has splint and sometimes wears at night to prevent wrist movement during sleep. Goal status: IN PROGRESS  5.  Pt will ind recall at least 3 edema management and/or pain management strategies. Baseline: Pt demo'ing pain and edema primarily affecting R thumb. Goal status: IN PROGRESS  LONG TERM GOALS: Target date: 08/14/23  Patient will demo improved FM coordination as evidenced by completing nine-hole peg with use of RUE in 22 seconds or less with no more than 1 drop. Baseline: 9 Hole Peg test: Right: 24 sec with x1 drop; Left: 22 sec with no drops. Goal status: IN PROGRESS  2.  Patient will demonstrate at least 31 lbs RUE grip strength as needed to open jars and other containers.  Baseline: Grip strength: Right: 27, 27, 28 (27.3 lbs average) lbs; Left: 36, 39, 37 (37.3 lbs average)  lbs Goal status: IN PROGRESS  3.  Patient will demonstrate at least 16% improvement with quick Dash score (reporting 36.3% disability or less) indicating improved functional use of affected extremity.  Baseline: Quick Dash: 52.3% deficit Goal status: IN PROGRESS  4.  Pt will verbalize or demo understanding of A/E options and adaptive strategies for ADL/IADL tasks, including handwriting and work-related tasks, in order to decrease pain of RUE. Baseline: Pt reported pain during many functional tasks, including handwriting and many work-related tasks.  Goal status: IN PROGRESS   ASSESSMENT:  CLINICAL IMPRESSION: Pt demonstrating good mobility and technique with each AROM and PROM exercises. Pt is  demonstrating improved tolerance of thumb composite flexion with decreased pain and increased ROM. Pt would continue to benefit from skilled OT services in the outpatient setting to work on impairments as noted below.  PERFORMANCE DEFICITS: in functional skills including ADLs, IADLs, coordination, dexterity, sensation, edema, strength, pain, flexibility, Fine motor control, Gross motor control, mobility, body mechanics, endurance, and UE functional use, cognitive skills including energy/drive, and psychosocial skills including environmental adaptation.     PLAN:  OT FREQUENCY: 2x/week  OT DURATION: 6 weeks  PLANNED INTERVENTIONS: 97168 OT Re-evaluation, 97535 self care/ADL training, 16109 therapeutic exercise, 97530 therapeutic activity, 97112 neuromuscular re-education, 97140 manual therapy, 97035 ultrasound, 97018 paraffin, 60454 fluidotherapy, 97760 Orthotic Initial, 97761 Prosthetic Initial, 97763 Orthotic/Prosthetic subsequent, passive range of motion, functional mobility training, energy conservation, patient/family education, and DME and/or AE instructions  RECOMMENDED OTHER SERVICES: n/a  CONSULTED AND AGREED WITH PLAN OF CARE: Patient  PLAN FOR NEXT SESSION:  REVIEW and add to PRN - HEP: Tendon glides and gentle thumb AROM Sleep positioning handout Joint protection handout - REVIEW PRN A/E and adaptive strategies for ADLs/IADLs (e.g. handwriting, see objective information above for details about specific activities) Assess splint fit - pt has over-the-counter splint and wears at night - did it fit better decreased pain/numbness in the morning? Edema management and pain management strategies (e.g. modalities)      Ilhan Madan, OTR/L 07/03/2023, 8:10 AM   [facility] [address] [place], [address] [place], [place] [address] Phone # [phone] Fax # [phone]

## 2023-07-09 ENCOUNTER — Ambulatory Visit: Admitting: Occupational Therapy

## 2023-07-09 DIAGNOSIS — R29898 Other symptoms and signs involving the musculoskeletal system: Secondary | ICD-10-CM

## 2023-07-09 DIAGNOSIS — R278 Other lack of coordination: Secondary | ICD-10-CM

## 2023-07-09 DIAGNOSIS — M6281 Muscle weakness (generalized): Secondary | ICD-10-CM

## 2023-07-09 DIAGNOSIS — R208 Other disturbances of skin sensation: Secondary | ICD-10-CM

## 2023-07-09 NOTE — Therapy (Signed)
 OUTPATIENT OCCUPATIONAL THERAPY ORTHO  Treatment Note  Patient Name: Jill Melendez MRN: [id number] DOB:18-Jul-1970, 53 y.o., female Today's Date: 07/09/2023  PCP: Trenton Frock, PA-C  REFERRING PROVIDER: Trenton Frock, PA-C   END OF SESSION:  OT End of Session - 07/09/23 0858     Visit Number 7    Number of Visits 13   including eval   Date for OT Re-Evaluation 08/14/23    Authorization Type [employer] required, VL: 30    OT Start Time 0850    OT Stop Time 0930    OT Time Calculation (min) 40 min    Activity Tolerance Patient tolerated treatment well    Behavior During Therapy WFL for tasks assessed/performed                   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   Influenza due to influenza virus, type B 04/12/2014   Migraine    Sepsis (HCC) 04/11/2014   Past Surgical History:  Procedure Laterality Date   BREAST EXCISIONAL BIOPSY Left 1994   BREAST SURGERY     COLONOSCOPY     SIGMOIDOSCOPY     some upper Gi test     pt states was not EGD   Patient Active Problem List   Diagnosis Date Noted   Non-alcoholic fatty liver disease 03/20/2023    ONSET DATE: 06/04/2023 (referral date)  REFERRING DIAG: M79.644,G89.29 (ICD-10-CM) - Chronic pain of right thumb   THERAPY DIAG:  Other lack of coordination  Muscle weakness (generalized)  Other symptoms and signs involving the musculoskeletal system  Other disturbances of skin sensation  Rationale for Evaluation and Treatment: Rehabilitation  SUBJECTIVE:   SUBJECTIVE STATEMENT: Goes by: Jill Melendez  Pt reports "my thumb is a whole lot better, I have a lot more motion in it."  Pt reports that she is waking up with numbness in her hand, "but on the other side".    Pt accompanied by: self  PERTINENT HISTORY: anxiety, hx of migraine/headache   Per 06/04/23 PA-C  Progress Notes: "Chronic pain of right thumb... Refer to occupational therapy for hand strengthening and massage techniques."  PRECAUTIONS: None  RED FLAGS: None   WEIGHT BEARING RESTRICTIONS: No  PAIN:  Are you having pain? Yes: NPRS scale: 3/10 Pain location: R hand, median, ulnar distribution Pain description: stiff Aggravating factors: grasping with thumb: e.g. opening containers, turning a key Relieving factors: moving  FALLS: Has patient fallen in last 6 months? No  LIVING ENVIRONMENT: Lives with: lives with their family Lives in: House/apartment Stairs: Yes: Internal: 12 steps; can reach both and External: 5 steps; can reach both Has following equipment at home: Single point cane, Walker - 2 wheeled, Wheelchair (manual), Tour manager, and Grab bars  PLOF: Independent with basic ADLs, currently working as Advertising account planner and completing massages, currently driving  PATIENT GOALS: "to be able to learn to do things differently that won't put such stress on my hand"  NEXT MD VISIT: not yet scheduled  OBJECTIVE:  Note: Objective measures were completed at Evaluation unless otherwise noted.  HAND DOMINANCE: Right  ADLs: Eating: ind Grooming: ind, difficulty using hair dryer and hair styling tools d/t difficulty grasping objects Upper body dressing: ind, difficulty with zippers/buttons Lower body dressing: ind, wears slip-on shoes, sometimes difficult to button jeans Bathing: no concerns  Handwriting: pt has adjusted grasp patten from standard tripod grip to modified tripod grip and other grip patterns d/t pain. Pt wrote simple sentence with standard tripod grip pattern with 100% legibility though OT noted pt demo'd tighter grasp as task progressed. Pt reported handwriting currently at 60% of current level of function.  Opening jars/containers: difficult  Occupation: Advertising account planner and massage - pt reported repetitive tasks at work and has attempted changing position to  avoid using thumb when completing massage which has now aggravated wrist. Pt reported pain when squeezing product out of tube containers and when using nail clippers.   FUNCTIONAL OUTCOME MEASURES: Quick Dash: 52.3% deficit    UPPER EXTREMITY ROM:     Active ROM Right eval Left eval  Shoulder flexion [facility]   Shoulder abduction    Shoulder adduction    Shoulder extension    Shoulder internal rotation    Shoulder external rotation    Elbow flexion    Elbow extension    Wrist flexion WFL - pt c/o pain    Wrist extension WFL- pt c/o pain when lifting objects when in wrist ext   Wrist ulnar deviation    Wrist radial deviation    Wrist pronation WFL - per pt "stiff"   Wrist supination WFL   (Blank rows = not tested)  Active ROM Right eval Left eval  Thumb MCP (0-60)    Thumb IP (0-80)    Thumb Radial abd/add (0-55)     Thumb Palmar abd/add (0-45)     Thumb Opposition to Small Finger     Index MCP (0-90)     Index PIP (0-100)     Index DIP (0-70)      Long MCP (0-90)      Long PIP (0-100)      Long DIP (0-70)      Ring MCP (0-90)      Ring PIP (0-100)      Ring DIP (0-70)      Little MCP (0-90)      Little PIP (0-100)      Little DIP (0-70)      (Blank rows = not tested)   UPPER EXTREMITY MMT:     MMT Right eval Left eval  Shoulder flexion    Shoulder abduction    Shoulder adduction    Shoulder extension    Shoulder internal rotation    Shoulder external rotation    Middle trapezius    Lower trapezius    Elbow flexion    Elbow extension    Wrist flexion    Wrist extension    Wrist ulnar deviation    Wrist radial deviation    Wrist pronation    Wrist supination    (Blank rows = not tested)  RUE: Digits 2-5 WFL flex/ext. Thumb IP joint - impaired. Pt reported thumb pain with thumb flex. Pt reported "stabbing pain sometimes in center of anterior palm when grasping objects mainly in morning.  LUE: Digits 1-5 WFL flex/ext.  HAND FUNCTION: Grip  strength: Right: 27, 27, 28 (27.3 lbs average) lbs; Left: 36, 39, 37 (37.3 lbs average)  lbs  Pt reported pain of R thumb when holding dynamometer. Pt questioning if "cyst" is present.  COORDINATION: 9 Hole Peg test: Right: 24 sec with x1 drop; Left: 22 sec with no drops.  Pt reported dropping items more frequently with RUE "a couple times per day."   SENSATION: Pt reported numb when waking up this morning which happens frequently.  Pt reported it can take 1-2 hours to resolve.   EDEMA:  Pt reported swelling at R thumb IP joint, sometimes wraps thumb using First Aid tape. Pt questioning presence of "cyst" at base of thumb near MCP joint on anterior side and OT noted more prominent bony feel at RUE location of discomfort compared to LUE.  COGNITION: Overall cognitive status: Within functional limits for tasks assessed  OBSERVATIONS: Pt was pleasant and ambulated ind without A/E.   TREATMENT DATE:       07/09/23 Reviewed STGs in regards to splint wear over night, sleep positioning, and joint protection strategies in use.  See Goal section below for further information.  Pt with onset of increased pain and tingling in R hand (median nerve distribution) that may be due to wrist positioning in resting hand splint over night to ensure proper thumb placement - as brace may be marginally too large.  Pt is verbalizing good understanding of joint protection strategies but still having some difficulty with work tasks while maintaining good positioning.  OT educating on reducing weight carried on shoulder or back.  Encouraging use of light weight cross body bag or backpack style when carrying more weight or for longer periods.   Massage: OT providing massage at thenar eminence into palm and and proximal wrist in circular, perpendicular, and parallel directions along area of pain. OT encouraging attempts at self-massage and/or deep pressure as tolerated.  Tendon glides: pt demonstrating good understanding  of each and reports of decreased tingling in R hand when ensuring that she maintains wrist in neutral position during each movement.      07/03/23 Modalities: reiterated education on use of heat modality for pain management and ROM.  Pt reports running her hands under warm water first thing in the morning to loosen hands up prior to brushing teeth and utilizing warm packs with her clients at work.   Sleep: pt reports that she is sleeping better at night, she does recognize some stiffness due to wearing R hand splint but is not experiencing any numbness when waking.  Thumb ROM: AROM: engaged in thumb opposition, blocked flexion at IP joint, MP joint, and CMC joint, circumduction clockwise and counter-clockwise, thumb abduction and thumb extension.  OT providing demonstration and cues for technique, adding additional cues/clarification for each on handout.  Pt completed each exercise x8.   PROM: engaged in passive flexion at IP joint, MP joint, and CMC joint, as well as abduction and composite flexion and extension.  Pt completing each x5 with focus on maintaining sustained hold 10 seconds. Wrist ROM: engaged in wrist flexion and extension with focus on AROM, followed by PROM x5 each.  OT providing demonstration and cues for technique.    06/30/23 flexbar: engaged in grip strengthening and wrist flexion/extension with (red) flexbar with focus on hand positioning with thumb in opposition vs thumb next to index finger. OT providing education on not overdoing it with thumb opposition while also still encouraging pt to attempt positioning to continue to maintain good hand contours and strength. Pegs: engaged in placing and removing large grip pegs from resistive peg board with focus on pinch, grip, and in-hand manipulation with addition of rotation of pegs in finger tips.  Pt reports tingling in long and ring finger as task continued.  OT assessed positioning and recognized pt with increased wrist flexion and  ulnar deviation, therefore recommending pt start across body and move towards R side to allow for improved postural alignment and not having to  reach over pegs resulting in increased flexion and deviation.  Pt verbalized decreased strain working L > R. Putty: engaged in 3 jaw chuck and lateral pinch with yellow theraputty.  OT providing min verbal cues for technique with lateral pinch.  OT also repositioning putty towards midline to decrease overuse of shoulder and elbow and reiterating education on body mechanics and positioning to facilitate increased alignment.  Pt verbalizing understanding.  Engaged in pinch and pull with putty and then rolling into small balls with thumb, index, and long fingers.  Pt reports increased fatigue and onset of numbness in fingers when rolling putty into ball.   PATIENT EDUCATION: Education details: see today's tx above Person educated: Patient Education method: Explanation Education comprehension: verbalized understanding  HOME EXERCISE PROGRAM: Access Code: [email or web address] URL: [email or web address] Date: 06/19/2023 Prepared by: [facility] - Outpatient  Rehab - [facility]  Exercises - Seated Digit Tendon Gliding  - 4 x daily - 5 reps - "Duck mouth"  - 4 x daily - 5 reps - Thumb Opposition  - 4 x daily - 5 reps - Seated Thumb Composite Flexion AROM  - 4 x daily - 5 reps - 5 hold - Seated Thumb Circumduction  - 4 x daily - 5 reps  GOALS: Goals reviewed with patient? Yes  SHORT TERM GOALS: Target date: 07/17/23  Pt will be ind with HEP using visual handouts. Baseline: new to outpt OT 07/09/23: completing exercises intermittently between clients, on busy days ~2x/day before and after work. Goal status: on going  2.  Pt will ind recall at least 3 joint protection strategies. Baseline: new to outpt OT. Pt reporting hand pain with many ADL/IADL tasks. 07/09/23: being aware of hand positioning during manicures/pedicures, changing hand  position/neutral position when cutting foods, carrying less things/using cross body bag Goal status: MET   3.  Pt will verbalize understanding of sleep positioning strategies.  Baseline: Pt reported waking up and hands are numb. 07/09/23: pt reports sleeping with resting hand splint and trying to sleep with arms down by side or over stomach, does still end up on stomach with arms overhead but then with increased pain Goal status: MET  4.  Pt will verbalize understanding of splint wear/care schedule and purpose of splint.  Baseline: Pt reported pt has splint and sometimes wears at night to prevent wrist movement during sleep. 07/09/23: pt reports improved fit of splint and wear at night time, with reports of decreased pain and issues with thumb but increased finger pain/numbness Goal status: MET  5.  Pt will ind recall at least 3 edema management and/or pain management strategies. Baseline: Pt demo'ing pain and edema primarily affecting R thumb. 07/09/23: utilizing heat, massage, resting hand splint for night time, and joint protection strategies for decreased pain Goal status: MET  LONG TERM GOALS: Target date: 08/14/23  Patient will demo improved FM coordination as evidenced by completing nine-hole peg with use of RUE in 22 seconds or less with no more than 1 drop. Baseline: 9 Hole Peg test: Right: 24 sec with x1 drop; Left: 22 sec with no drops. Goal status: IN PROGRESS  2.  Patient will demonstrate at least 31 lbs RUE grip strength as needed to open jars and other containers.  Baseline: Grip strength: Right: 27, 27, 28 (27.3 lbs average) lbs; Left: 36, 39, 37 (37.3 lbs average)  lbs 07/09/23: Right: 47, 47, 42 (45.3# average) lbs. Goal status: MET   3.  Patient will demonstrate at least 16% improvement with  quick Dash score (reporting 36.3% disability or less) indicating improved functional use of affected extremity.  Baseline: Quick Dash: 52.3% deficit Goal status: IN PROGRESS  4.  Pt  will verbalize or demo understanding of A/E options and adaptive strategies for ADL/IADL tasks, including handwriting and work-related tasks, in order to decrease pain of RUE. Baseline: Pt reported pain during many functional tasks, including handwriting and many work-related tasks.  Goal status: IN PROGRESS   ASSESSMENT:  CLINICAL IMPRESSION: Pt reporting onset of increased pain, numbness and tingling in R hand and fingers, but improved ROM and decreased pain in R thumb.  OT encouraged pt to reassess fit of splint for night time wear as pt reports onset of pain and numbness upon waking in the mornings.  OT reiterating neutral wrist positioning to decrease "pinching" effect in wrist impacting sensation and numb feeling.  Pt verbalizing good understanding of education this session and recall of edema and pain management as well as joint protection strategies.  Pt is demonstrating improved tolerance of thumb composite flexion with decreased pain and increased ROM. Pt would continue to benefit from skilled OT services in the outpatient setting to work on impairments as noted below.  PERFORMANCE DEFICITS: in functional skills including ADLs, IADLs, coordination, dexterity, sensation, edema, strength, pain, flexibility, Fine motor control, Gross motor control, mobility, body mechanics, endurance, and UE functional use, cognitive skills including energy/drive, and psychosocial skills including environmental adaptation.     PLAN:  OT FREQUENCY: 2x/week  OT DURATION: 6 weeks  PLANNED INTERVENTIONS: 97168 OT Re-evaluation, 97535 self care/ADL training, 16109 therapeutic exercise, 97530 therapeutic activity, 97112 neuromuscular re-education, 97140 manual therapy, 97035 ultrasound, 97018 paraffin, 60454 fluidotherapy, 97760 Orthotic Initial, 97761 Prosthetic Initial, 97763 Orthotic/Prosthetic subsequent, passive range of motion, functional mobility training, energy conservation, patient/family education,  and DME and/or AE instructions  RECOMMENDED OTHER SERVICES: n/a  CONSULTED AND AGREED WITH PLAN OF CARE: Patient  PLAN FOR NEXT SESSION:  Assess how splint is fitting, decreased wrist extension does that get rid of tingling/numbness in middle finger and remaining fingers.  Assess trigger finger in middle finger  REVIEW and add to PRN - HEP: Tendon glides and gentle thumb AROM Sleep positioning handout Joint protection handout - REVIEW PRN A/E and adaptive strategies for ADLs/IADLs (e.g. handwriting, see objective information above for details about specific activities) Assess splint fit - pt has over-the-counter splint and wears at night - did it fit better decreased pain/numbness in the morning? Edema management and pain management strategies (e.g. modalities)      Dquan Cortopassi, OTR/L 07/09/2023, 8:58 AM   [facility] at [facility] [address] [place], [address] [place], [place] [address] Phone # [phone] Fax # [phone]

## 2023-07-14 ENCOUNTER — Ambulatory Visit: Admitting: Occupational Therapy

## 2023-07-14 ENCOUNTER — Encounter: Payer: Self-pay | Admitting: Physician Assistant

## 2023-07-14 DIAGNOSIS — R278 Other lack of coordination: Secondary | ICD-10-CM | POA: Diagnosis not present

## 2023-07-14 DIAGNOSIS — R29898 Other symptoms and signs involving the musculoskeletal system: Secondary | ICD-10-CM

## 2023-07-14 DIAGNOSIS — M6281 Muscle weakness (generalized): Secondary | ICD-10-CM

## 2023-07-14 DIAGNOSIS — R208 Other disturbances of skin sensation: Secondary | ICD-10-CM

## 2023-07-14 NOTE — Therapy (Signed)
 OUTPATIENT OCCUPATIONAL THERAPY ORTHO  Treatment Note  Patient Name: Jill Melendez MRN: [id number] DOB:07-13-1970, 53 y.o., female Today's Date: 07/14/2023  PCP: Trenton Frock, PA-C  REFERRING PROVIDER: Trenton Frock, PA-C   END OF SESSION:  OT End of Session - 07/14/23 0947     Visit Number 8    Number of Visits 13   including eval   Date for OT Re-Evaluation 08/14/23    Authorization Type [employer] [place] required, VL: 30    OT Start Time 0848    OT Stop Time 0933    OT Time Calculation (min) 45 min    Activity Tolerance Patient tolerated treatment well    Behavior During Therapy [facility] for tasks assessed/performed                    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   Influenza due to influenza virus, type B 04/12/2014   Migraine    Sepsis (HCC) 04/11/2014   Past Surgical History:  Procedure Laterality Date   BREAST EXCISIONAL BIOPSY Left 1994   BREAST SURGERY     COLONOSCOPY     SIGMOIDOSCOPY     some upper Gi test     pt states was not EGD   Patient Active Problem List   Diagnosis Date Noted   Non-alcoholic fatty liver disease 03/20/2023    ONSET DATE: 06/04/2023 (referral date)  REFERRING DIAG: M79.644,G89.29 (ICD-10-CM) - Chronic pain of right thumb   THERAPY DIAG:  Other lack of coordination  Muscle weakness (generalized)  Other symptoms and signs involving the musculoskeletal system  Other disturbances of skin sensation  Rationale for Evaluation and Treatment: Rehabilitation  SUBJECTIVE:   SUBJECTIVE STATEMENT: Goes by: Jill Melendez  Pt reports that she tried sleeping without the resting hand splint but that was terrible and she woke up in excruciating pain.  Pt reports attempting to sleep in less restrictive hand splint with thumb taped.  Pt reports wearing resting hand splint overnight last  night without pain in thumb this morning, but having tingling in last 3 fingers and into arm.  Pt reports pain and numbness such that she had to brush teeth with L hand.  Pt reports "cyst" in thumb was way more pronounced yesterday.  Pt accompanied by: self  PERTINENT HISTORY: anxiety, hx of migraine/headache   Per 06/04/23 PA-C Progress Notes: "Chronic pain of right thumb... Refer to occupational therapy for hand strengthening and massage techniques."  PRECAUTIONS: None  RED FLAGS: None   WEIGHT BEARING RESTRICTIONS: No  PAIN:  Are you having pain? Yes: NPRS scale: 7/10 Pain location: R palm, median, ulnar distribution of hand Pain description: stiff, stabbing pain in the middle of the palm Aggravating factors: grasping with thumb: e.g. opening containers, turning a key Relieving factors: moving  FALLS: Has patient fallen in last 6 months? No  LIVING ENVIRONMENT: Lives with: lives with their family Lives in: House/apartment Stairs: Yes: Internal: 12 steps; can reach both and External: 5 steps; can reach both Has following equipment at home: Single point cane, Walker - 2 wheeled, Wheelchair (manual), Shower bench, and Grab bars  PLOF: Independent with basic ADLs, currently working as Advertising account planner and completing massages, currently driving  PATIENT GOALS: "to be able to learn to do things differently that won't put such stress on my hand"  NEXT  MD VISIT: not yet scheduled  OBJECTIVE:  Note: Objective measures were completed at Evaluation unless otherwise noted.  HAND DOMINANCE: Right  ADLs: Eating: ind Grooming: ind, difficulty using hair dryer and hair styling tools d/t difficulty grasping objects Upper body dressing: ind, difficulty with zippers/buttons Lower body dressing: ind, wears slip-on shoes, sometimes difficult to button jeans Bathing: no concerns  Handwriting: pt has adjusted grasp patten from standard tripod grip to modified tripod grip and other grip  patterns d/t pain. Pt wrote simple sentence with standard tripod grip pattern with 100% legibility though OT noted pt demo'd tighter grasp as task progressed. Pt reported handwriting currently at 60% of current level of function.  Opening jars/containers: difficult  Occupation: Advertising account planner and massage - pt reported repetitive tasks at work and has attempted changing position to avoid using thumb when completing massage which has now aggravated wrist. Pt reported pain when squeezing product out of tube containers and when using nail clippers.   FUNCTIONAL OUTCOME MEASURES: Quick Dash: 52.3% deficit    UPPER EXTREMITY ROM:     Active ROM Right eval Left eval  Shoulder flexion [facility]   Shoulder abduction    Shoulder adduction    Shoulder extension    Shoulder internal rotation    Shoulder external rotation    Elbow flexion    Elbow extension    Wrist flexion WFL - pt c/o pain    Wrist extension WFL- pt c/o pain when lifting objects when in wrist ext   Wrist ulnar deviation    Wrist radial deviation    Wrist pronation WFL - per pt "stiff"   Wrist supination WFL   (Blank rows = not tested)  Active ROM Right eval Left eval  Thumb MCP (0-60)    Thumb IP (0-80)    Thumb Radial abd/add (0-55)     Thumb Palmar abd/add (0-45)     Thumb Opposition to Small Finger     Index MCP (0-90)     Index PIP (0-100)     Index DIP (0-70)      Long MCP (0-90)      Long PIP (0-100)      Long DIP (0-70)      Ring MCP (0-90)      Ring PIP (0-100)      Ring DIP (0-70)      Little MCP (0-90)      Little PIP (0-100)      Little DIP (0-70)      (Blank rows = not tested)   UPPER EXTREMITY MMT:     MMT Right eval Left eval  Shoulder flexion    Shoulder abduction    Shoulder adduction    Shoulder extension    Shoulder internal rotation    Shoulder external rotation    Middle trapezius    Lower trapezius    Elbow flexion    Elbow extension    Wrist flexion    Wrist extension     Wrist ulnar deviation    Wrist radial deviation    Wrist pronation    Wrist supination    (Blank rows = not tested)  RUE: Digits 2-5 WFL flex/ext. Thumb IP joint - impaired. Pt reported thumb pain with thumb flex. Pt reported "stabbing pain sometimes in center of anterior palm when grasping objects mainly in morning.  LUE: Digits 1-5 WFL flex/ext.  HAND FUNCTION: Grip strength: Right: 27, 27, 28 (27.3 lbs average) lbs; Left: 36, 39, 37 (37.3 lbs average)  lbs  Pt reported pain of R thumb when holding dynamometer. Pt questioning if "cyst" is present.  COORDINATION: 9 Hole Peg test: Right: 24 sec with x1 drop; Left: 22 sec with no drops.  Pt reported dropping items more frequently with RUE "a couple times per day."   SENSATION: Pt reported numb when waking up this morning which happens frequently. Pt reported it can take 1-2 hours to resolve.   EDEMA:  Pt reported swelling at R thumb IP joint, sometimes wraps thumb using First Aid tape. Pt questioning presence of "cyst" at base of thumb near MCP joint on anterior side and OT noted more prominent bony feel at RUE location of discomfort compared to LUE.  COGNITION: Overall cognitive status: Within functional limits for tasks assessed  OBSERVATIONS: Pt was pleasant and ambulated ind without A/E.   TREATMENT DATE:       07/14/23 Splint fit: Pt demonstrating donning/doffing of resting hand splint.  Pt with demonstrating good positioning of wrist in neutral position with not too much pressure through median or ulnar nerve.  Pt reports awful pain when attempting to sleep without splint therefore resumed wear with improvements in pain of thumb, but still having pain and numbness in long, ring, and small fingers.  Symptom management: engaged in discussion about possibility of medications and/or alternative testing to further assess pain and tingling in fingers.  Pt stated that she did not ever take the celecoxib  due to concern with reaction  to NSAIDS.  Pt questioning if she should fill it. Coordination: engaged in picking up pegs and peg rotation with large grip pegs and returning to resistive peg board.  Pt demonstrating good mobility with no drops, however reports numbness in long, ring, and small fingers. 9 hole peg: Right: 23.82 sec and no drops Symptom management: OT encouraged pt to keep a pain journal, noting pain in the morning and pain throughout the day as well as changes in the day that could be attributing to her symptoms. 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  Pt also reporting that they have had some mold concerns in their home that they are having treated - which could be attributing to her symptoms.  OT educated pt on how to use voice to text on Notes app on phone to keep journal as handwriting too painful.    07/09/23 Reviewed STGs in regards to splint wear over night, sleep positioning, and joint protection strategies in use.  See Goal section below for further information.  Pt with onset of increased pain and tingling in R hand (median nerve distribution) that may be due to wrist positioning in resting hand splint over night to ensure proper thumb placement - as brace may be marginally too large.  Pt is verbalizing good understanding of joint protection strategies but still having some difficulty with work tasks while maintaining good positioning.  OT educating on reducing weight carried on shoulder or back.  Encouraging use of light weight cross body bag or backpack style when carrying more weight or for longer periods.   Massage: OT providing massage at thenar eminence into palm and and proximal wrist in circular, perpendicular, and parallel directions along area of pain. OT encouraging attempts at self-massage and/or deep pressure as tolerated.  Tendon glides: pt demonstrating good understanding of each and reports of decreased tingling in R hand when ensuring that she maintains  wrist in neutral position during each movement.      07/03/23 Modalities:  reiterated education on use of heat modality for pain management and ROM.  Pt reports running her hands under warm water first thing in the morning to loosen hands up prior to brushing teeth and utilizing warm packs with her clients at work.   Sleep: pt reports that she is sleeping better at night, she does recognize some stiffness due to wearing R hand splint but is not experiencing any numbness when waking.  Thumb ROM: AROM: engaged in thumb opposition, blocked flexion at IP joint, MP joint, and CMC joint, circumduction clockwise and counter-clockwise, thumb abduction and thumb extension.  OT providing demonstration and cues for technique, adding additional cues/clarification for each on handout.  Pt completed each exercise x8.   PROM: engaged in passive flexion at IP joint, MP joint, and CMC joint, as well as abduction and composite flexion and extension.  Pt completing each x5 with focus on maintaining sustained hold 10 seconds. Wrist ROM: engaged in wrist flexion and extension with focus on AROM, followed by PROM x5 each.  OT providing demonstration and cues for technique.   PATIENT EDUCATION: Education details: see today's tx above Person educated: Patient Education method: Explanation Education comprehension: verbalized understanding  HOME EXERCISE PROGRAM: Access Code: [email or web address] URL: [email or web address] Date: 06/19/2023 Prepared by: [facility] - Outpatient  Rehab - [facility]  Exercises - Seated Digit Tendon Gliding  - 4 x daily - 5 reps - "Duck mouth"  - 4 x daily - 5 reps - Thumb Opposition  - 4 x daily - 5 reps - Seated Thumb Composite Flexion AROM  - 4 x daily - 5 reps - 5 hold - Seated Thumb Circumduction  - 4 x daily - 5 reps  GOALS: Goals reviewed with patient? Yes  SHORT TERM GOALS: Target date: 07/17/23  Pt will be ind with HEP using visual handouts. Baseline: new to outpt  OT 07/09/23: completing exercises intermittently between clients, on busy days ~2x/day before and after work. Goal status: on going  2.  Pt will ind recall at least 3 joint protection strategies. Baseline: new to outpt OT. Pt reporting hand pain with many ADL/IADL tasks. 07/09/23: being aware of hand positioning during manicures/pedicures, changing hand position/neutral position when cutting foods, carrying less things/using cross body bag Goal status: MET   3.  Pt will verbalize understanding of sleep positioning strategies.  Baseline: Pt reported waking up and hands are numb. 07/09/23: pt reports sleeping with resting hand splint and trying to sleep with arms down by side or over stomach, does still end up on stomach with arms overhead but then with increased pain Goal status: MET  4.  Pt will verbalize understanding of splint wear/care schedule and purpose of splint.  Baseline: Pt reported pt has splint and sometimes wears at night to prevent wrist movement during sleep. 07/09/23: pt reports improved fit of splint and wear at night time, with reports of decreased pain and issues with thumb but increased finger pain/numbness Goal status: MET  5.  Pt will ind recall at least 3 edema management and/or pain management strategies. Baseline: Pt demo'ing pain and edema primarily affecting R thumb. 07/09/23: utilizing heat, massage, resting hand splint for night time, and joint protection strategies for decreased pain Goal status: MET  LONG TERM GOALS: Target date: 08/14/23  Patient will demo improved FM coordination as evidenced by completing nine-hole peg with use of RUE in 22 seconds or less with no more than 1 drop. Baseline: 9 Hole Peg test: Right: 24 sec with  x1 drop; Left: 22 sec with no drops. Goal status: IN PROGRESS  2.  Patient will demonstrate at least 31 lbs RUE grip strength as needed to open jars and other containers.  Baseline: Grip strength: Right: 27, 27, 28 (27.3 lbs average)  lbs; Left: 36, 39, 37 (37.3 lbs average)  lbs 07/09/23: Right: 47, 47, 42 (45.3# average) lbs. Goal status: MET   3.  Patient will demonstrate at least 16% improvement with quick Dash score (reporting 36.3% disability or less) indicating improved functional use of affected extremity.  Baseline: Quick Dash: 52.3% deficit Goal status: IN PROGRESS  4.  Pt will verbalize or demo understanding of A/E options and adaptive strategies for ADL/IADL tasks, including handwriting and work-related tasks, in order to decrease pain of RUE. Baseline: Pt reported pain during many functional tasks, including handwriting and many work-related tasks.  Goal status: IN PROGRESS   ASSESSMENT:  CLINICAL IMPRESSION: Pt reporting onset of increased pain, numbness and tingling in R hand and fingers, and even some swelling in R thumb this session.  Pt reports continued use of splint for night time aiding in decreased pain in thumb, after trials of not wearing.  OT encouraged pt to f/u with PCP in regards to change in symptoms and ongoing symptoms.  Pt to keep a pain/symptom journal to assess changes as pt with progressive changes in fingers compared to thumb over the last week or so.  Pt verbalizing good understanding of education this session and recall of edema and pain management as well as joint protection strategies.  Pt is demonstrating improved tolerance of thumb composite flexion with decreased pain and increased ROM.  Pt to take the next week to assess symptoms and allow time to f/u with PCP regarding any other referrals or recommendations.  Pt would continue to benefit from skilled OT services in the outpatient setting to work on impairments as noted below.  PERFORMANCE DEFICITS: in functional skills including ADLs, IADLs, coordination, dexterity, sensation, edema, strength, pain, flexibility, Fine motor control, Gross motor control, mobility, body mechanics, endurance, and UE functional use, cognitive skills  including energy/drive, and psychosocial skills including environmental adaptation.     PLAN:  OT FREQUENCY: 2x/week  OT DURATION: 6 weeks  PLANNED INTERVENTIONS: 97168 OT Re-evaluation, 97535 self care/ADL training, 09811 therapeutic exercise, 97530 therapeutic activity, 97112 neuromuscular re-education, 97140 manual therapy, 97035 ultrasound, 97018 paraffin, 91478 fluidotherapy, 97760 Orthotic Initial, 97761 Prosthetic Initial, 97763 Orthotic/Prosthetic subsequent, passive range of motion, functional mobility training, energy conservation, patient/family education, and DME and/or AE instructions  RECOMMENDED OTHER SERVICES: n/a  CONSULTED AND AGREED WITH PLAN OF CARE: Patient  PLAN FOR NEXT SESSION:  Any f/u with pain journal? Or feedback from MD?  REVIEW and add to PRN - HEP: Tendon glides and gentle thumb AROM Sleep positioning handout Joint protection handout - REVIEW PRN A/E and adaptive strategies for ADLs/IADLs (e.g. handwriting, see objective information above for details about specific activities) Assess splint fit - pt has over-the-counter splint and wears at night - did it fit better decreased pain/numbness in the morning? Edema management and pain management strategies (e.g. modalities)      Anthonette Kinsman, OTR/L 07/14/2023, 9:47 AM   [facility] [address] [place], [address] [place], [place] [address] Phone # [phone] Fax # [phone]

## 2023-07-16 ENCOUNTER — Ambulatory Visit: Admitting: Occupational Therapy

## 2023-07-21 ENCOUNTER — Ambulatory Visit: Admitting: Occupational Therapy

## 2023-07-22 ENCOUNTER — Ambulatory Visit: Admitting: Occupational Therapy

## 2023-07-22 DIAGNOSIS — R278 Other lack of coordination: Secondary | ICD-10-CM | POA: Diagnosis not present

## 2023-07-22 DIAGNOSIS — R29898 Other symptoms and signs involving the musculoskeletal system: Secondary | ICD-10-CM

## 2023-07-22 DIAGNOSIS — M6281 Muscle weakness (generalized): Secondary | ICD-10-CM

## 2023-07-22 NOTE — Therapy (Signed)
 OUTPATIENT OCCUPATIONAL THERAPY ORTHO  Treatment Note  Patient Name: Jill Melendez MRN: [id number] DOB:1971/01/20, 53 y.o., female Today's Date: 07/22/2023  PCP: Trenton Frock, PA-C  REFERRING PROVIDER: Trenton Frock, PA-C   END OF SESSION:  OT End of Session - 07/22/23 1515     Visit Number 9    Number of Visits 13   including eval   Date for OT Re-Evaluation 08/14/23    Authorization Type [employer] required, VL: 30    OT Start Time 1318    OT Stop Time 1402    OT Time Calculation (min) 44 min    Activity Tolerance Patient tolerated treatment well    Behavior During Therapy WFL for tasks assessed/performed                     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   Influenza due to influenza virus, type B 04/12/2014   Migraine    Sepsis (HCC) 04/11/2014   Past Surgical History:  Procedure Laterality Date   BREAST EXCISIONAL BIOPSY Left 1994   BREAST SURGERY     COLONOSCOPY     SIGMOIDOSCOPY     some upper Gi test     pt states was not EGD   Patient Active Problem List   Diagnosis Date Noted   Non-alcoholic fatty liver disease 03/20/2023    ONSET DATE: 06/04/2023 (referral date)  REFERRING DIAG: M79.644,G89.29 (ICD-10-CM) - Chronic pain of right thumb   THERAPY DIAG:  Other lack of coordination  Muscle weakness (generalized)  Other symptoms and signs involving the musculoskeletal system  Rationale for Evaluation and Treatment: Rehabilitation  SUBJECTIVE:   SUBJECTIVE STATEMENT: Goes by: [name]  Pt reports felling better over the weekend, on vacation, even sleeping one night without hand splints.  Pt reports middle finger "wanting to trigger".    Pt reports changing to lighter weight purse, but still having issues with numbness in R upper arm and forearm when wearing purse on R shoulder.   Pt  reports having an appt with PCP tomorrow for blood work.    Pt accompanied by: self  PERTINENT HISTORY: anxiety, hx of migraine/headache   Per 06/04/23 PA-C Progress Notes: "Chronic pain of right thumb... Refer to occupational therapy for hand strengthening and massage techniques."  PRECAUTIONS: None  RED FLAGS: None   WEIGHT BEARING RESTRICTIONS: No  PAIN:  Are you having pain? Yes: NPRS scale: 3/10 Pain location: R palm and forearm Pain description: stiff Aggravating factors: grasping with thumb: e.g. opening containers, turning a key Relieving factors: moving  FALLS: Has patient fallen in last 6 months? No  LIVING ENVIRONMENT: Lives with: lives with their family Lives in: House/apartment Stairs: Yes: Internal: 12 steps; can reach both and External: 5 steps; can reach both Has following equipment at home: Single point cane, Walker - 2 wheeled, Wheelchair (manual), Tour manager, and Grab bars  PLOF: Independent with basic ADLs, currently working as Advertising account planner and completing massages, currently driving  PATIENT GOALS: "to be able to learn to do things differently that won't put such stress on my hand"  NEXT MD VISIT: not yet scheduled  OBJECTIVE:  Note: Objective measures were completed at Evaluation unless otherwise noted.  HAND DOMINANCE: Right  ADLs: Eating: ind Grooming: ind, difficulty using hair dryer and hair styling tools d/t difficulty  grasping objects Upper body dressing: ind, difficulty with zippers/buttons Lower body dressing: ind, wears slip-on shoes, sometimes difficult to button jeans Bathing: no concerns  Handwriting: pt has adjusted grasp patten from standard tripod grip to modified tripod grip and other grip patterns d/t pain. Pt wrote simple sentence with standard tripod grip pattern with 100% legibility though OT noted pt demo'd tighter grasp as task progressed. Pt reported handwriting currently at 60% of current level of function.  Opening  jars/containers: difficult  Occupation: Advertising account planner and massage - pt reported repetitive tasks at work and has attempted changing position to avoid using thumb when completing massage which has now aggravated wrist. Pt reported pain when squeezing product out of tube containers and when using nail clippers.   FUNCTIONAL OUTCOME MEASURES: Quick Dash: 52.3% deficit    UPPER EXTREMITY ROM:     Active ROM Right eval Left eval  Shoulder flexion [facility]   Shoulder abduction    Shoulder adduction    Shoulder extension    Shoulder internal rotation    Shoulder external rotation    Elbow flexion    Elbow extension    Wrist flexion WFL - pt c/o pain    Wrist extension WFL- pt c/o pain when lifting objects when in wrist ext   Wrist ulnar deviation    Wrist radial deviation    Wrist pronation WFL - per pt "stiff"   Wrist supination WFL   (Blank rows = not tested)  Active ROM Right eval Left eval  Thumb MCP (0-60)    Thumb IP (0-80)    Thumb Radial abd/add (0-55)     Thumb Palmar abd/add (0-45)     Thumb Opposition to Small Finger     Index MCP (0-90)     Index PIP (0-100)     Index DIP (0-70)      Long MCP (0-90)      Long PIP (0-100)      Long DIP (0-70)      Ring MCP (0-90)      Ring PIP (0-100)      Ring DIP (0-70)      Little MCP (0-90)      Little PIP (0-100)      Little DIP (0-70)      (Blank rows = not tested)   UPPER EXTREMITY MMT:     MMT Right eval Left eval  Shoulder flexion    Shoulder abduction    Shoulder adduction    Shoulder extension    Shoulder internal rotation    Shoulder external rotation    Middle trapezius    Lower trapezius    Elbow flexion    Elbow extension    Wrist flexion    Wrist extension    Wrist ulnar deviation    Wrist radial deviation    Wrist pronation    Wrist supination    (Blank rows = not tested)  RUE: Digits 2-5 WFL flex/ext. Thumb IP joint - impaired. Pt reported thumb pain with thumb flex. Pt reported "stabbing  pain sometimes in center of anterior palm when grasping objects mainly in morning.  LUE: Digits 1-5 WFL flex/ext.  HAND FUNCTION: Grip strength: Right: 27, 27, 28 (27.3 lbs average) lbs; Left: 36, 39, 37 (37.3 lbs average)  lbs  Pt reported pain of R thumb when holding dynamometer. Pt questioning if "cyst" is present.  COORDINATION: 9 Hole Peg test: Right: 24 sec with x1 drop; Left: 22 sec with no drops.  Pt reported  dropping items more frequently with RUE "a couple times per day."   SENSATION: Pt reported numb when waking up this morning which happens frequently. Pt reported it can take 1-2 hours to resolve.   EDEMA:  Pt reported swelling at R thumb IP joint, sometimes wraps thumb using First Aid tape. Pt questioning presence of "cyst" at base of thumb near MCP joint on anterior side and OT noted more prominent bony feel at RUE location of discomfort compared to LUE.  COGNITION: Overall cognitive status: Within functional limits for tasks assessed  OBSERVATIONS: Pt was pleasant and ambulated ind without A/E.   TREATMENT DATE:       07/22/23 Self-care: engaged in conversation about pt's relief of symptoms over the weekend, as she was out of town.  Pt reports ongoing questions and concerns about possible mold in her home and that impact on her pain.  OT reiterated recommendation to begin a pain/symptom journal as she was expressing decreased pain over the weekend.  Pt does report that she has been noticing increased tingling in her lips after eating a banana and wondering if from banana vs licking lips after wearing N95 at work.  Again reiterating keeping up with signs/symptoms to share with MD at upcoming visit. Trigger finger: OT educated on use of prefabricated splints vs custom fabricated vs use of taping for support and to decrease extreme flexion to allow triggering. ROM: engaged in PIP flexion blocking with OT providing demonstration and cues for technique and rationale.  Pt  completing x5 at long finger.  Reviewed tendon glides and thumb opposition with composite flexion.  Pt demonstrating improved ROM with decreased pain with thumb opposition with composite flexion.    07/14/23 Splint fit: Pt demonstrating donning/doffing of resting hand splint.  Pt with demonstrating good positioning of wrist in neutral position with not too much pressure through median or ulnar nerve.  Pt reports awful pain when attempting to sleep without splint therefore resumed wear with improvements in pain of thumb, but still having pain and numbness in long, ring, and small fingers.  Symptom management: engaged in discussion about possibility of medications and/or alternative testing to further assess pain and tingling in fingers.  Pt stated that she did not ever take the celecoxib  due to concern with reaction to NSAIDS.  Pt questioning if she should fill it. Coordination: engaged in picking up pegs and peg rotation with large grip pegs and returning to resistive peg board.  Pt demonstrating good mobility with no drops, however reports numbness in long, ring, and small fingers. 9 hole peg: Right: 23.82 sec and no drops Symptom management: OT encouraged pt to keep a pain journal, noting pain in the morning and pain throughout the day as well as changes in the day that could be attributing to her symptoms. 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  Pt also reporting that they have had some mold concerns in their home that they are having treated - which could be attributing to her symptoms.  OT educated pt on how to use voice to text on Notes app on phone to keep journal as handwriting too painful.    07/09/23 Reviewed STGs in regards to splint wear over night, sleep positioning, and joint protection strategies in use.  See Goal section below for further information.  Pt with onset of increased pain and tingling in R hand (median nerve distribution) that may be  due to wrist positioning in resting hand splint  over night to ensure proper thumb placement - as brace may be marginally too large.  Pt is verbalizing good understanding of joint protection strategies but still having some difficulty with work tasks while maintaining good positioning.  OT educating on reducing weight carried on shoulder or back.  Encouraging use of light weight cross body bag or backpack style when carrying more weight or for longer periods.   Massage: OT providing massage at thenar eminence into palm and and proximal wrist in circular, perpendicular, and parallel directions along area of pain. OT encouraging attempts at self-massage and/or deep pressure as tolerated.  Tendon glides: pt demonstrating good understanding of each and reports of decreased tingling in R hand when ensuring that she maintains wrist in neutral position during each movement.      PATIENT EDUCATION: Education details: see today's tx above Person educated: Patient Education method: Explanation Education comprehension: verbalized understanding  HOME EXERCISE PROGRAM: Access Code: [email or web address] URL: [email or web address] Date: 06/19/2023 Prepared by: [facility] - Outpatient  Rehab - [facility]  Exercises - Seated Digit Tendon Gliding  - 4 x daily - 5 reps - "Duck mouth"  - 4 x daily - 5 reps - Thumb Opposition  - 4 x daily - 5 reps - Seated Thumb Composite Flexion AROM  - 4 x daily - 5 reps - 5 hold - Seated Thumb Circumduction  - 4 x daily - 5 reps  GOALS: Goals reviewed with patient? Yes  SHORT TERM GOALS: Target date: 07/17/23  Pt will be ind with HEP using visual handouts. Baseline: new to outpt OT 07/09/23: completing exercises intermittently between clients, on busy days ~2x/day before and after work. Goal status: on going  2.  Pt will ind recall at least 3 joint protection strategies. Baseline: new to outpt OT. Pt reporting hand pain with many ADL/IADL tasks. 07/09/23:  being aware of hand positioning during manicures/pedicures, changing hand position/neutral position when cutting foods, carrying less things/using cross body bag Goal status: MET   3.  Pt will verbalize understanding of sleep positioning strategies.  Baseline: Pt reported waking up and hands are numb. 07/09/23: pt reports sleeping with resting hand splint and trying to sleep with arms down by side or over stomach, does still end up on stomach with arms overhead but then with increased pain Goal status: MET  4.  Pt will verbalize understanding of splint wear/care schedule and purpose of splint.  Baseline: Pt reported pt has splint and sometimes wears at night to prevent wrist movement during sleep. 07/09/23: pt reports improved fit of splint and wear at night time, with reports of decreased pain and issues with thumb but increased finger pain/numbness Goal status: MET  5.  Pt will ind recall at least 3 edema management and/or pain management strategies. Baseline: Pt demo'ing pain and edema primarily affecting R thumb. 07/09/23: utilizing heat, massage, resting hand splint for night time, and joint protection strategies for decreased pain Goal status: MET  LONG TERM GOALS: Target date: 08/14/23  Patient will demo improved FM coordination as evidenced by completing nine-hole peg with use of RUE in 22 seconds or less with no more than 1 drop. Baseline: 9 Hole Peg test: Right: 24 sec with x1 drop; Left: 22 sec with no drops. Goal status: IN PROGRESS  2.  Patient will demonstrate at least 31 lbs RUE grip strength as needed to open jars and other containers.  Baseline: Grip strength: Right: 27, 27, 28 (27.3 lbs average) lbs; Left: 36, 39, 37 (  37.3 lbs average)  lbs 07/09/23: Right: 47, 47, 42 (45.3# average) lbs. Goal status: MET   3.  Patient will demonstrate at least 16% improvement with quick Dash score (reporting 36.3% disability or less) indicating improved functional use of affected extremity.   Baseline: Quick Dash: 52.3% deficit Goal status: IN PROGRESS  4.  Pt will verbalize or demo understanding of A/E options and adaptive strategies for ADL/IADL tasks, including handwriting and work-related tasks, in order to decrease pain of RUE. Baseline: Pt reported pain during many functional tasks, including handwriting and many work-related tasks.  Goal status: IN PROGRESS   ASSESSMENT:  CLINICAL IMPRESSION: Pt reporting continued increased pain, numbness and tingling in R hand and fingers, and even some swelling in R thumb,  however did decrease over the weekend when she was out of town for a 3 day weekend.  Pt reports tolerating sleep without wearing splint x1 night without any pain upon waking, however has resumed wearing splint upon return home due to increased pain. Pt with upcoming f/u with PCP in regards to ongoing and exacerbated symptoms.  Reiterated recommendation for pt to keep a pain/symptom journal to assess changes as pt with progressive changes in fingers compared to thumb over the 2 weeks now.  Pt would continue to benefit from skilled OT services in the outpatient setting to work on impairments as noted below.  PERFORMANCE DEFICITS: in functional skills including ADLs, IADLs, coordination, dexterity, sensation, edema, strength, pain, flexibility, Fine motor control, Gross motor control, mobility, body mechanics, endurance, and UE functional use, cognitive skills including energy/drive, and psychosocial skills including environmental adaptation.     PLAN:  OT FREQUENCY: 2x/week  OT DURATION: 6 weeks  PLANNED INTERVENTIONS: 97168 OT Re-evaluation, 97535 self care/ADL training, 16109 therapeutic exercise, 97530 therapeutic activity, 97112 neuromuscular re-education, 97140 manual therapy, 97035 ultrasound, 97018 paraffin, 60454 fluidotherapy, 97760 Orthotic Initial, 97761 Prosthetic Initial, 97763 Orthotic/Prosthetic subsequent, passive range of motion, functional mobility  training, energy conservation, patient/family education, and DME and/or AE instructions  RECOMMENDED OTHER SERVICES: n/a  CONSULTED AND AGREED WITH PLAN OF CARE: Patient  PLAN FOR NEXT SESSION:  Any f/u with pain journal? Or feedback from MD?  REVIEW and add to PRN - HEP: Tendon glides and gentle thumb AROM Sleep positioning handout Joint protection handout - REVIEW PRN A/E and adaptive strategies for ADLs/IADLs (e.g. handwriting, see objective information above for details about specific activities) Assess splint fit - pt has over-the-counter splint and wears at night - did it fit better decreased pain/numbness in the morning? Edema management and pain management strategies (e.g. modalities)      Robie Mcniel, OTR/L 07/22/2023, 3:16 PM   [facility] at [facility] [address] [place], [address] [place], [place] [address] Phone # [phone] Fax # [phone]

## 2023-07-23 ENCOUNTER — Encounter: Payer: Self-pay | Admitting: Physician Assistant

## 2023-07-23 ENCOUNTER — Ambulatory Visit: Admitting: Physician Assistant

## 2023-07-23 VITALS — BP 118/79 | HR 65 | Ht 60.0 in | Wt 123.4 lb

## 2023-07-23 DIAGNOSIS — M2550 Pain in unspecified joint: Secondary | ICD-10-CM | POA: Diagnosis not present

## 2023-07-23 DIAGNOSIS — M25541 Pain in joints of right hand: Secondary | ICD-10-CM

## 2023-07-23 DIAGNOSIS — D696 Thrombocytopenia, unspecified: Secondary | ICD-10-CM

## 2023-07-23 DIAGNOSIS — R200 Anesthesia of skin: Secondary | ICD-10-CM

## 2023-07-23 DIAGNOSIS — Z820 Family history of epilepsy and other diseases of the nervous system: Secondary | ICD-10-CM

## 2023-07-23 DIAGNOSIS — R29898 Other symptoms and signs involving the musculoskeletal system: Secondary | ICD-10-CM

## 2023-07-23 DIAGNOSIS — R202 Paresthesia of skin: Secondary | ICD-10-CM

## 2023-07-23 LAB — CBC WITH DIFFERENTIAL/PLATELET
Basophils Absolute: 0 10*3/uL (ref 0.0–0.1)
Basophils Relative: 0.9 % (ref 0.0–3.0)
Eosinophils Absolute: 0 10*3/uL (ref 0.0–0.7)
Eosinophils Relative: 1.1 % (ref 0.0–5.0)
HCT: 40 % (ref 36.0–46.0)
Hemoglobin: 13.5 g/dL (ref 12.0–15.0)
Lymphocytes Relative: 32.3 % (ref 12.0–46.0)
Lymphs Abs: 1.1 10*3/uL (ref 0.7–4.0)
MCHC: 33.6 g/dL (ref 30.0–36.0)
MCV: 88.3 fl (ref 78.0–100.0)
Monocytes Absolute: 0.3 10*3/uL (ref 0.1–1.0)
Monocytes Relative: 8.7 % (ref 3.0–12.0)
Neutro Abs: 2 10*3/uL (ref 1.4–7.7)
Neutrophils Relative %: 57 % (ref 43.0–77.0)
Platelets: 159 10*3/uL (ref 150.0–400.0)
RBC: 4.53 Mil/uL (ref 3.87–5.11)
RDW: 12.6 % (ref 11.5–15.5)
WBC: 3.5 10*3/uL — ABNORMAL LOW (ref 4.0–10.5)

## 2023-07-23 LAB — C-REACTIVE PROTEIN: CRP: <1 mg/dL (ref 0.5–20.0)

## 2023-07-23 LAB — SEDIMENTATION RATE: Sed Rate: 3 mm/h (ref 0–30)

## 2023-07-23 NOTE — Progress Notes (Unsigned)
 Established patient visit   Patient: Jill Melendez   DOB: 1970-08-26   53 y.o. Female  MRN: [id number] Visit Date: 07/23/2023  Today's healthcare provider: Trenton Frock, PA-C   Chief Complaint  Patient presents with   Numbness    Pt went to occupational therapy, pt is concerned of ALS as it does run in her family "all 3 of my grandmothers sisters that had it"  "Now my middle finger is triggering, I had to brush my teeth with my left hand =. For almost the first hour I can't use my R hand" Pt states 3 weeks ago "I had intense pain in both hips, it was like a bone pain. I was getting a pedicure done"    Subjective    Discussed the use of AI scribe software for clinical note transcription with the patient, who gave verbal consent to proceed.  History of Present Illness   Jill Melendez is a 53 year old female who presents with leg weakness and hand numbness.  She experienced one episode of leg weakness that was severe enough to prevent standing or walking, with the one episode resolving by the next morning.   Right hand numbness sometimes necessitates the use of her non-dominant hand for tasks. Swelling and pain in her right hand is present, with her thumb feeling tight and not triggering. She uses a brace at night to alleviate pain and numbness, which aids her sleep. Her OT recommended autoimmune testing  There is a family history of ALS, with her grandmother's sisters affected. She is concerned this could be a factor for her.      Medications: Outpatient Medications Prior to Visit  Medication Sig   cetirizine (ZYRTEC) 10 MG chewable tablet Chew 10 mg by mouth daily. (Patient not taking: Reported on 07/23/2023)   nortriptyline  (PAMELOR ) 10 MG capsule Take 1 capsule (10 mg total) by mouth at bedtime. (Patient not taking: Reported on 07/23/2023)   No facility-administered medications prior to visit.    Review of Systems  Constitutional:  Negative for fatigue and fever.   Respiratory:  Negative for cough and shortness of breath.   Cardiovascular:  Negative for chest pain and leg swelling.  Gastrointestinal:  Negative for abdominal pain.  Musculoskeletal:  Positive for arthralgias and myalgias.  Neurological:  Positive for weakness. Negative for dizziness and headaches.       Objective    BP 118/79 (BP Location: Right Arm, Patient Position: Sitting, Cuff Size: Normal)   Pulse 65   Ht 5' (1.524 m)   Wt 123 lb 6.4 oz (56 kg)   LMP 09/24/2021 (Approximate)   SpO2 99%   BMI 24.10 kg/m    Physical Exam Vitals reviewed.  Constitutional:      Appearance: She is not ill-appearing.  HENT:     Head: Normocephalic.  Eyes:     Conjunctiva/sclera: Conjunctivae normal.  Cardiovascular:     Rate and Rhythm: Normal rate.  Pulmonary:     Effort: Pulmonary effort is normal. No respiratory distress.  Musculoskeletal:     Comments: Upper extremity 5/5 strength Lower extremity all 5/5 except flexion of hip  Neurological:     Mental Status: She is alert and oriented to person, place, and time.  Psychiatric:        Mood and Affect: Mood normal.        Behavior: Behavior normal.     Results for orders placed or performed in visit on 07/23/23  C-reactive protein  Result Value Ref Range   CRP <1.0 0.5 - 20.0 mg/dL  Sedimentation rate  Result Value Ref Range   Sed Rate 3 0 - 30 mm/hr  Rheumatoid Factor  Result Value Ref Range   Rheumatoid fact SerPl-aCnc <10 <14 IU/mL  CBC w/Diff  Result Value Ref Range   WBC 3.5 (L) 4.0 - 10.5 K/uL   RBC 4.53 3.87 - 5.11 Mil/uL   Hemoglobin 13.5 12.0 - 15.0 g/dL   HCT 78.4 69.6 - 29.5 %   MCV 88.3 78.0 - 100.0 fl   MCHC 33.6 30.0 - 36.0 g/dL   RDW 28.4 13.2 - 44.0 %   Platelets 159.0 150.0 - 400.0 K/uL   Neutrophils Relative % 57.0 43.0 - 77.0 %   Lymphocytes Relative 32.3 12.0 - 46.0 %   Monocytes Relative 8.7 3.0 - 12.0 %   Eosinophils Relative 1.1 0.0 - 5.0 %   Basophils Relative 0.9 0.0 - 3.0 %   Neutro  Abs 2.0 1.4 - 7.7 K/uL   Lymphs Abs 1.1 0.7 - 4.0 K/uL   Monocytes Absolute 0.3 0.1 - 1.0 K/uL   Eosinophils Absolute 0.0 0.0 - 0.7 K/uL   Basophils Absolute 0.0 0.0 - 0.1 K/uL    Assessment & Plan    Arthralgia of right hand Numbness and tingling in right hand Multiple joint pain Will start autoimmune w/u  Suggesting nerve testing to r/o carpal tunnel.  Given we are referring to neuro already, they may be able to facilitate  -     ANA -     C-reactive protein -     Sedimentation rate -     Anti-DNA antibody, double-stranded -     Rheumatoid factor -     Ambulatory referral to Neurology  Weakness of both lower extremities Family history of Ardath Bears disease Only weakness on exam is with some hip flexion. Some pain when standing.  Will refer to neuro -     Ambulatory referral to Neurology  Thrombocytopenia (HCC) -     CBC with Differential/Platelet   Return if symptoms worsen or fail to improve.       Trenton Frock, PA-C  [facility] at [facility] [phone] (phone) [phone] (fax)  [facility]

## 2023-07-24 ENCOUNTER — Encounter: Payer: Self-pay | Admitting: Physician Assistant

## 2023-07-24 LAB — RHEUMATOID FACTOR: Rheumatoid fact SerPl-aCnc: <10 [IU]/mL (ref <14)

## 2023-07-24 LAB — ANTI-DNA ANTIBODY, DOUBLE-STRANDED: ds DNA Ab: <1 [IU]/mL

## 2023-07-24 LAB — ANA: Anti Nuclear Antibody (ANA): NEGATIVE

## 2023-07-27 ENCOUNTER — Ambulatory Visit: Payer: Self-pay | Admitting: Physician Assistant

## 2023-07-28 ENCOUNTER — Encounter: Payer: Self-pay | Admitting: Neurology

## 2023-07-29 ENCOUNTER — Ambulatory Visit: Attending: Physician Assistant | Admitting: Occupational Therapy

## 2023-07-29 DIAGNOSIS — R208 Other disturbances of skin sensation: Secondary | ICD-10-CM | POA: Diagnosis present

## 2023-07-29 DIAGNOSIS — M6281 Muscle weakness (generalized): Secondary | ICD-10-CM | POA: Insufficient documentation

## 2023-07-29 DIAGNOSIS — R278 Other lack of coordination: Secondary | ICD-10-CM | POA: Insufficient documentation

## 2023-07-29 NOTE — Therapy (Deleted)
 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  Rationale for Evaluation and Treatment: Rehabilitation  SUBJECTIVE:   SUBJECTIVE STATEMENT: Goes by: Jill Melendez  Pt reports felling better over the weekend, on vacation, even sleeping one night without hand splints.  Pt reports middle finger "wanting to trigger".    Pt reports changing to lighter weight purse, but still having issues with numbness in R upper arm and forearm when wearing purse on R shoulder.   Pt reports having an appt with PCP tomorrow for blood work.    Pt accompanied by: self  PERTINENT HISTORY: anxiety, hx of migraine/headache   Per 06/04/23 PA-C Progress Notes: "Chronic pain of right thumb... Refer to occupational therapy for hand strengthening and massage techniques."  PRECAUTIONS: None  RED FLAGS: None   WEIGHT BEARING RESTRICTIONS: No  PAIN:  Are you having pain? Yes: NPRS scale: 3/10 Pain location: R palm and forearm Pain description: stiff Aggravating factors: grasping  with thumb: e.g. opening containers, turning a key Relieving factors: moving  FALLS: Has patient fallen in last 6 months? No  LIVING ENVIRONMENT: Lives with: lives with their family Lives in: House/apartment Stairs: Yes: Internal: 12 steps; can reach both and External: 5 steps; can reach both Has following equipment at home: Single point cane, Walker - 2 wheeled, Wheelchair (manual), Tour manager, and Grab bars  PLOF: Independent with basic ADLs, currently working as Advertising account planner and completing massages, currently driving  PATIENT GOALS: "to be able to learn to do things differently that won't put such stress on my hand"  NEXT MD VISIT: not yet scheduled  OBJECTIVE:  Note: Objective measures were completed at Evaluation unless otherwise noted.  HAND DOMINANCE: Right  ADLs: Eating: ind Grooming: ind, difficulty using hair dryer and hair styling tools d/t difficulty grasping objects Upper body dressing: ind, difficulty with zippers/buttons Lower body dressing: ind, wears slip-on shoes, sometimes difficult to button jeans Bathing: no concerns  Handwriting: pt has adjusted grasp patten from standard tripod grip to modified tripod grip and other grip patterns d/t pain. Pt wrote simple sentence with standard tripod grip pattern with 100% legibility though OT noted pt demo'd tighter grasp as task progressed. Pt reported handwriting currently at 60% of current level of function.  Opening jars/containers: difficult  Occupation: Advertising account planner and massage - pt reported repetitive tasks at work and has attempted changing position to avoid using thumb when completing massage which has  now aggravated wrist. Pt reported pain when squeezing product out of tube containers and when using nail clippers.   FUNCTIONAL OUTCOME MEASURES: Quick Dash: 52.3% deficit    UPPER EXTREMITY ROM:     Active ROM Right eval Left eval  Shoulder flexion [facility]   Shoulder abduction    Shoulder adduction     Shoulder extension    Shoulder internal rotation    Shoulder external rotation    Elbow flexion    Elbow extension    Wrist flexion WFL - pt c/o pain    Wrist extension WFL- pt c/o pain when lifting objects when in wrist ext   Wrist ulnar deviation    Wrist radial deviation    Wrist pronation WFL - per pt "stiff"   Wrist supination WFL   (Blank rows = not tested)  Active ROM Right eval Left eval  Thumb MCP (0-60)    Thumb IP (0-80)    Thumb Radial abd/add (0-55)     Thumb Palmar abd/add (0-45)     Thumb Opposition to Small Finger     Index MCP (0-90)     Index PIP (0-100)     Index DIP (0-70)      Long MCP (0-90)      Long PIP (0-100)      Long DIP (0-70)      Ring MCP (0-90)      Ring PIP (0-100)      Ring DIP (0-70)      Little MCP (0-90)      Little PIP (0-100)      Little DIP (0-70)      (Blank rows = not tested)   UPPER EXTREMITY MMT:     MMT Right eval Left eval  Shoulder flexion    Shoulder abduction    Shoulder adduction    Shoulder extension    Shoulder internal rotation    Shoulder external rotation    Middle trapezius    Lower trapezius    Elbow flexion    Elbow extension    Wrist flexion    Wrist extension    Wrist ulnar deviation    Wrist radial deviation    Wrist pronation    Wrist supination    (Blank rows = not tested)  RUE: Digits 2-5 WFL flex/ext. Thumb IP joint - impaired. Pt reported thumb pain with thumb flex. Pt reported "stabbing pain sometimes in center of anterior palm when grasping objects mainly in morning.  LUE: Digits 1-5 WFL flex/ext.  HAND FUNCTION: Grip strength: Right: 27, 27, 28 (27.3 lbs average) lbs; Left: 36, 39, 37 (37.3 lbs average)  lbs  Pt reported pain of R thumb when holding dynamometer. Pt questioning if "cyst" is present.  COORDINATION: 9 Hole Peg test: Right: 24 sec with x1 drop; Left: 22 sec with no drops.  Pt reported dropping items more frequently with RUE "a couple times per  day."   SENSATION: Pt reported numb when waking up this morning which happens frequently. Pt reported it can take 1-2 hours to resolve.   EDEMA:  Pt reported swelling at R thumb IP joint, sometimes wraps thumb using First Aid tape. Pt questioning presence of "cyst" at base of thumb near MCP joint on anterior side and OT noted more prominent bony feel at RUE location of discomfort compared to LUE.  COGNITION: Overall cognitive status: Within functional limits for tasks assessed  OBSERVATIONS: Pt was pleasant and ambulated ind without A/E.   TREATMENT DATE:  07/22/23 Self-care: engaged in conversation about pt's relief of symptoms over the weekend, as she was out of town.  Pt reports ongoing questions and concerns about possible mold in her home and that impact on her pain.  OT reiterated recommendation to begin a pain/symptom journal as she was expressing decreased pain over the weekend.  Pt does report that she has been noticing increased tingling in her lips after eating a banana and wondering if from banana vs licking lips after wearing N95 at work.  Again reiterating keeping up with signs/symptoms to share with MD at upcoming visit. Trigger finger: OT educated on use of prefabricated splints vs custom fabricated vs use of taping for support and to decrease extreme flexion to allow triggering. ROM: engaged in PIP flexion blocking with OT providing demonstration and cues for technique and rationale.  Pt completing x5 at long finger.  Reviewed tendon glides and thumb opposition with composite flexion.  Pt demonstrating improved ROM with decreased pain with thumb opposition with composite flexion.    07/14/23 Splint fit: Pt demonstrating donning/doffing of resting hand splint.  Pt with demonstrating good positioning of wrist in neutral position with not too much pressure through median or ulnar nerve.  Pt reports awful pain when attempting to sleep without splint therefore resumed wear with  improvements in pain of thumb, but still having pain and numbness in long, ring, and small fingers.  Symptom management: engaged in discussion about possibility of medications and/or alternative testing to further assess pain and tingling in fingers.  Pt stated that she did not ever take the celecoxib  due to concern with reaction to NSAIDS.  Pt questioning if she should fill it. Coordination: engaged in picking up pegs and peg rotation with large grip pegs and returning to resistive peg board.  Pt demonstrating good mobility with no drops, however reports numbness in long, ring, and small fingers. 9 hole peg: Right: 23.82 sec and no drops Symptom management: OT encouraged pt to keep a pain journal, noting pain in the morning and pain throughout the day as well as changes in the day that could be attributing to her symptoms. 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  Pt also reporting that they have had some mold concerns in their home that they are having treated - which could be attributing to her symptoms.  OT educated pt on how to use voice to text on Notes app on phone to keep journal as handwriting too painful.    07/09/23 Reviewed STGs in regards to splint wear over night, sleep positioning, and joint protection strategies in use.  See Goal section below for further information.  Pt with onset of increased pain and tingling in R hand (median nerve distribution) that may be due to wrist positioning in resting hand splint over night to ensure proper thumb placement - as brace may be marginally too large.  Pt is verbalizing good understanding of joint protection strategies but still having some difficulty with work tasks while maintaining good positioning.  OT educating on reducing weight carried on shoulder or back.  Encouraging use of light weight cross body bag or backpack style when carrying more weight or for longer periods.   Massage: OT providing massage at  thenar eminence into palm and and proximal wrist in circular, perpendicular, and parallel directions along area of pain. OT encouraging attempts at self-massage and/or deep pressure as tolerated.  Tendon glides: pt demonstrating good understanding of each and reports  of decreased tingling in R hand when ensuring that she maintains wrist in neutral position during each movement.      PATIENT EDUCATION: Education details: see today's tx above Person educated: Patient Education method: Explanation Education comprehension: verbalized understanding  HOME EXERCISE PROGRAM: Access Code: [email or web address] URL: [email or web address] Date: 06/19/2023 Prepared by: [facility] - Outpatient  Rehab - [facility]  Exercises - Seated Digit Tendon Gliding  - 4 x daily - 5 reps - "Duck mouth"  - 4 x daily - 5 reps - Thumb Opposition  - 4 x daily - 5 reps - Seated Thumb Composite Flexion AROM  - 4 x daily - 5 reps - 5 hold - Seated Thumb Circumduction  - 4 x daily - 5 reps  GOALS: Goals reviewed with patient? Yes  SHORT TERM GOALS: Target date: 07/17/23  Pt will be ind with HEP using visual handouts. Baseline: new to outpt OT 07/09/23: completing exercises intermittently between clients, on busy days ~2x/day before and after work. Goal status: on going  2.  Pt will ind recall at least 3 joint protection strategies. Baseline: new to outpt OT. Pt reporting hand pain with many ADL/IADL tasks. 07/09/23: being aware of hand positioning during manicures/pedicures, changing hand position/neutral position when cutting foods, carrying less things/using cross body bag Goal status: MET   3.  Pt will verbalize understanding of sleep positioning strategies.  Baseline: Pt reported waking up and hands are numb. 07/09/23: pt reports sleeping with resting hand splint and trying to sleep with arms down by side or over stomach, does still end up on stomach with arms overhead but then with increased pain Goal  status: MET  4.  Pt will verbalize understanding of splint wear/care schedule and purpose of splint.  Baseline: Pt reported pt has splint and sometimes wears at night to prevent wrist movement during sleep. 07/09/23: pt reports improved fit of splint and wear at night time, with reports of decreased pain and issues with thumb but increased finger pain/numbness Goal status: MET  5.  Pt will ind recall at least 3 edema management and/or pain management strategies. Baseline: Pt demo'ing pain and edema primarily affecting R thumb. 07/09/23: utilizing heat, massage, resting hand splint for night time, and joint protection strategies for decreased pain Goal status: MET  LONG TERM GOALS: Target date: 08/14/23  Patient will demo improved FM coordination as evidenced by completing nine-hole peg with use of RUE in 22 seconds or less with no more than 1 drop. Baseline: 9 Hole Peg test: Right: 24 sec with x1 drop; Left: 22 sec with no drops. Goal status: IN PROGRESS  2.  Patient will demonstrate at least 31 lbs RUE grip strength as needed to open jars and other containers.  Baseline: Grip strength: Right: 27, 27, 28 (27.3 lbs average) lbs; Left: 36, 39, 37 (37.3 lbs average)  lbs 07/09/23: Right: 47, 47, 42 (45.3# average) lbs. Goal status: MET   3.  Patient will demonstrate at least 16% improvement with quick Dash score (reporting 36.3% disability or less) indicating improved functional use of affected extremity.  Baseline: Quick Dash: 52.3% deficit Goal status: IN PROGRESS  4.  Pt will verbalize or demo understanding of A/E options and adaptive strategies for ADL/IADL tasks, including handwriting and work-related tasks, in order to decrease pain of RUE. Baseline: Pt reported pain during many functional tasks, including handwriting and many work-related tasks.  Goal status: IN PROGRESS   ASSESSMENT:  CLINICAL IMPRESSION: Pt reporting continued increased pain, numbness  and tingling in R hand and  fingers, and even some swelling in R thumb,  however did decrease over the weekend when she was out of town for a 3 day weekend.  Pt reports tolerating sleep without wearing splint x1 night without any pain upon waking, however has resumed wearing splint upon return home due to increased pain. Pt with upcoming f/u with PCP in regards to ongoing and exacerbated symptoms.  Reiterated recommendation for pt to keep a pain/symptom journal to assess changes as pt with progressive changes in fingers compared to thumb over the 2 weeks now.  Pt would continue to benefit from skilled OT services in the outpatient setting to work on impairments as noted below.  PERFORMANCE DEFICITS: in functional skills including ADLs, IADLs, coordination, dexterity, sensation, edema, strength, pain, flexibility, Fine motor control, Gross motor control, mobility, body mechanics, endurance, and UE functional use, cognitive skills including energy/drive, and psychosocial skills including environmental adaptation.     PLAN:  OT FREQUENCY: 2x/week  OT DURATION: 6 weeks  PLANNED INTERVENTIONS: 97168 OT Re-evaluation, 97535 self care/ADL training, 40981 therapeutic exercise, 97530 therapeutic activity, 97112 neuromuscular re-education, 97140 manual therapy, 97035 ultrasound, 97018 paraffin, 19147 fluidotherapy, 97760 Orthotic Initial, 97761 Prosthetic Initial, 97763 Orthotic/Prosthetic subsequent, passive range of motion, functional mobility training, energy conservation, patient/family education, and DME and/or AE instructions  RECOMMENDED OTHER SERVICES: n/a  CONSULTED AND AGREED WITH PLAN OF CARE: Patient  PLAN FOR NEXT SESSION:  Any f/u with pain journal? Or feedback from MD?  REVIEW and add to PRN - HEP: Tendon glides and gentle thumb AROM Sleep positioning handout Joint protection handout - REVIEW PRN A/E and adaptive strategies for ADLs/IADLs (e.g. handwriting, see objective information above for details about specific  activities) Assess splint fit - pt has over-the-counter splint and wears at night - did it fit better decreased pain/numbness in the morning? Edema management and pain management strategies (e.g. modalities)      Mehtab Dolberry, OTR/L 07/29/2023, 11:14 AM   [facility] at [facility] [address] [place], [address] [place], [place] [address] Phone # [phone] Fax # [phone]

## 2023-07-29 NOTE — Therapy (Signed)
 OUTPATIENT OCCUPATIONAL THERAPY ORTHO  Treatment Note & Discharge  Patient Name: Jill Melendez MRN: [id number] DOB:[birth year], 53 y.o., female Today's Date: 07/29/2023  PCP: Trenton Frock, PA-C  REFERRING PROVIDER: Trenton Frock, PA-C   OCCUPATIONAL THERAPY DISCHARGE SUMMARY  Visits from Start of Care: 10  Current functional level related to goals / functional outcomes: Pt has met 5/5 STGs and 4/4 LTGs, demonstrating and reporting improved function and mobility in dominant RUE.  Pt has demonstrated decreased pain, improvements in coordination and strength, and use of splinting and sleep positioning to decrease pain.  Pt does still report numbness and tingling in R forearm and recently with increased onset of pain in LUE.  Pt is to have f/u with nerve conduction testing and is undergoing allergy testing as she is continuing to have concerns due to ongoing and changing symptoms.   Remaining deficits: Numbness and tingling in R forearm   Education / Equipment: ROM and coordination HEP, sleep positioning, wear and care of splint, joint protection strategies   Patient agrees to discharge. Patient goals were met. Patient is being discharged due to meeting the stated rehab goals..     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   Influenza due to influenza virus, type B 04/12/2014   Migraine    Sepsis (HCC) 04/11/2014    Past Surgical History:  Procedure Laterality Date   BREAST EXCISIONAL BIOPSY Left 1994   BREAST SURGERY     COLONOSCOPY     SIGMOIDOSCOPY     some upper Gi test     pt states was not EGD   Patient Active Problem List   Diagnosis Date Noted   Non-alcoholic fatty liver disease 03/20/2023    ONSET DATE: 06/04/2023 (referral date)  REFERRING DIAG: M79.644,G89.29 (ICD-10-CM) - Chronic pain of right thumb   THERAPY DIAG:  Other lack of coordination  Muscle weakness (generalized)  Other disturbances of skin sensation  Rationale for Evaluation and Treatment: Rehabilitation  SUBJECTIVE:   SUBJECTIVE STATEMENT: Goes by: Jill Melendez  Pt reports her blood work was fine, showing no abnormalities.  Pt states that she is being set up for a nerve conduction test.  Pt reports seeing an allergist tomorrow.  Pt accompanied by: self  PERTINENT HISTORY: anxiety, hx of migraine/headache   Per 06/04/23 PA-C Progress Notes: "Chronic pain of right thumb... Refer to occupational therapy for hand strengthening and massage techniques."  PRECAUTIONS: None  RED FLAGS: None   WEIGHT BEARING RESTRICTIONS: No  PAIN:  Are you having pain? Yes: NPRS scale: 8-9/10 Pain location: L palm and thumb Pain description: stiff, radiating Aggravating factors: grasping with thumb: e.g. opening containers, turning a key Relieving factors: rest, heat  FALLS: Has patient  fallen in last 6 months? No  LIVING ENVIRONMENT: Lives with: lives with their family Lives in: House/apartment Stairs: Yes: Internal: 12 steps; can reach both and External: 5 steps; can reach both Has following equipment at home: Single point cane, Walker - 2 wheeled, Wheelchair (manual), Tour manager, and Grab bars  PLOF: Independent with basic ADLs, currently working as Advertising account planner and completing massages, currently driving  PATIENT GOALS: "to be able to learn to do things differently that won't put such stress on my hand"  NEXT  MD VISIT: not yet scheduled  OBJECTIVE:  Note: Objective measures were completed at Evaluation unless otherwise noted.  HAND DOMINANCE: Right  ADLs: Eating: ind Grooming: ind, difficulty using hair dryer and hair styling tools d/t difficulty grasping objects Upper body dressing: ind, difficulty with zippers/buttons Lower body dressing: ind, wears slip-on shoes, sometimes difficult to button jeans Bathing: no concerns  Handwriting: pt has adjusted grasp patten from standard tripod grip to modified tripod grip and other grip patterns d/t pain. Pt wrote simple sentence with standard tripod grip pattern with 100% legibility though OT noted pt demo'd tighter grasp as task progressed. Pt reported handwriting currently at 60% of current level of function.  Opening jars/containers: difficult  Occupation: Advertising account planner and massage - pt reported repetitive tasks at work and has attempted changing position to avoid using thumb when completing massage which has now aggravated wrist. Pt reported pain when squeezing product out of tube containers and when using nail clippers.   FUNCTIONAL OUTCOME MEASURES: Quick Dash: 52.3% deficit      UPPER EXTREMITY ROM:     Active ROM Right eval Left eval  Shoulder flexion [facility]   Shoulder abduction    Shoulder adduction    Shoulder extension    Shoulder internal rotation    Shoulder external rotation    Elbow flexion    Elbow extension    Wrist flexion WFL - pt c/o pain    Wrist extension WFL- pt c/o pain when lifting objects when in wrist ext   Wrist ulnar deviation    Wrist radial deviation    Wrist pronation WFL - per pt "stiff"   Wrist supination WFL   (Blank rows = not tested)  Active ROM Right eval Left eval  Thumb MCP (0-60)    Thumb IP (0-80)    Thumb Radial abd/add (0-55)     Thumb Palmar abd/add (0-45)     Thumb Opposition to Small Finger     Index MCP (0-90)     Index PIP (0-100)     Index DIP (0-70)      Long MCP (0-90)       Long PIP (0-100)      Long DIP (0-70)      Ring MCP (0-90)      Ring PIP (0-100)      Ring DIP (0-70)      Little MCP (0-90)      Little PIP (0-100)      Little DIP (0-70)      (Blank rows = not tested)   UPPER EXTREMITY MMT:     MMT Right eval Left eval  Shoulder flexion    Shoulder abduction    Shoulder adduction    Shoulder extension    Shoulder internal rotation    Shoulder external rotation    Middle trapezius    Lower trapezius    Elbow flexion    Elbow extension    Wrist flexion    Wrist  extension    Wrist ulnar deviation    Wrist radial deviation    Wrist pronation    Wrist supination    (Blank rows = not tested)  RUE: Digits 2-5 WFL flex/ext. Thumb IP joint - impaired. Pt reported thumb pain with thumb flex. Pt reported "stabbing pain sometimes in center of anterior palm when grasping objects mainly in morning.  LUE: Digits 1-5 WFL flex/ext.  HAND FUNCTION: Grip strength: Right: 27, 27, 28 (27.3 lbs average) lbs; Left: 36, 39, 37 (37.3 lbs average)  lbs  Pt reported pain of R thumb when holding dynamometer. Pt questioning if "cyst" is present.  COORDINATION: 9 Hole Peg test: Right: 24 sec with x1 drop; Left: 22 sec with no drops.  Pt reported dropping items more frequently with RUE "a couple times per day."   SENSATION: Pt reported numb when waking up this morning which happens frequently. Pt reported it can take 1-2 hours to resolve.   EDEMA:  Pt reported swelling at R thumb IP joint, sometimes wraps thumb using First Aid tape. Pt questioning presence of "cyst" at base of thumb near MCP joint on anterior side and OT noted more prominent bony feel at RUE location of discomfort compared to LUE.  COGNITION: Overall cognitive status: Within functional limits for tasks assessed  OBSERVATIONS: Pt was pleasant and ambulated ind without A/E.   TREATMENT DATE:       07/29/23 ROM: pt demonstrating full thumb flexion with no reports of pain with thumb  opposition and composite flexion.  Pt reports decreased pain and increased functional use of dominant RUE. 9 hole peg: Right: 18 seconds with no drops; Left: 24 seconds with no drops Sleep: pt reports sleeping on cervical neck pillow over top of standard pillow.  Pt with improved positioning of arms by sleeping with arms by side/ decreased elbow flexion - pt no longer waking up with numbness but still does report onset of numbness in R arm with functional tasks such as brushing teeth.   Self-care: pt to f/u with nerve conduction testing and allergist about her ongoing concerns with various symptoms and pain in LUE and persistent tingling in RUE.    07/22/23 Self-care: engaged in conversation about pt's relief of symptoms over the weekend, as she was out of town.  Pt reports ongoing questions and concerns about possible mold in her home and that impact on her pain.  OT reiterated recommendation to begin a pain/symptom journal as she was expressing decreased pain over the weekend.  Pt does report that she has been noticing increased tingling in her lips after eating a banana and wondering if from banana vs licking lips after wearing N95 at work.  Again reiterating keeping up with signs/symptoms to share with MD at upcoming visit. Trigger finger: OT educated on use of prefabricated splints vs custom fabricated vs use of taping for support and to decrease extreme flexion to allow triggering. ROM: engaged in PIP flexion blocking with OT providing demonstration and cues for technique and rationale.  Pt completing x5 at long finger.  Reviewed tendon glides and thumb opposition with composite flexion.  Pt demonstrating improved ROM with decreased pain with thumb opposition with composite flexion.    07/14/23 Splint fit: Pt demonstrating donning/doffing of resting hand splint.  Pt with demonstrating good positioning of wrist in neutral position with not too much pressure through median or ulnar nerve.  Pt  reports awful pain when attempting to sleep without splint therefore resumed wear with improvements  in pain of thumb, but still having pain and numbness in long, ring, and small fingers.  Symptom management: engaged in discussion about possibility of medications and/or alternative testing to further assess pain and tingling in fingers.  Pt stated that she did not ever take the celecoxib  due to concern with reaction to NSAIDS.  Pt questioning if she should fill it. Coordination: engaged in picking up pegs and peg rotation with large grip pegs and returning to resistive peg board.  Pt demonstrating good mobility with no drops, however reports numbness in long, ring, and small fingers. 9 hole peg: Right: 23.82 sec and no drops Symptom management: OT encouraged pt to keep a pain journal, noting pain in the morning and pain throughout the day as well as changes in the day that could be attributing to her symptoms. 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  Pt also reporting that they have had some mold concerns in their home that they are having treated - which could be attributing to her symptoms.  OT educated pt on how to use voice to text on Notes app on phone to keep journal as handwriting too painful.    PATIENT EDUCATION: Education details: see today's tx above Person educated: Patient Education method: Explanation Education comprehension: verbalized understanding  HOME EXERCISE PROGRAM: Access Code: [email or web address] URL: [email or web address] Date: 06/19/2023 Prepared by: [facility] - Outpatient  Rehab - [facility]  Exercises - Seated Digit Tendon Gliding  - 4 x daily - 5 reps - "Duck mouth"  - 4 x daily - 5 reps - Thumb Opposition  - 4 x daily - 5 reps - Seated Thumb Composite Flexion AROM  - 4 x daily - 5 reps - 5 hold - Seated Thumb Circumduction  - 4 x daily - 5 reps  GOALS: Goals reviewed with patient? Yes  SHORT TERM GOALS:  Target date: 07/17/23  Pt will be ind with HEP using visual handouts. Baseline: new to outpt OT 07/09/23: completing exercises intermittently between clients, on busy days ~2x/day before and after work. Goal status: MET  2.  Pt will ind recall at least 3 joint protection strategies. Baseline: new to outpt OT. Pt reporting hand pain with many ADL/IADL tasks. 07/09/23: being aware of hand positioning during manicures/pedicures, changing hand position/neutral position when cutting foods, carrying less things/using cross body bag Goal status: MET   3.  Pt will verbalize understanding of sleep positioning strategies.  Baseline: Pt reported waking up and hands are numb. 07/09/23: pt reports sleeping with resting hand splint and trying to sleep with arms down by side or over stomach, does still end up on stomach with arms overhead but then with increased pain Goal status: MET  4.  Pt will verbalize understanding of splint wear/care schedule and purpose of splint.  Baseline: Pt reported pt has splint and sometimes wears at night to prevent wrist movement during sleep. 07/09/23: pt reports improved fit of splint and wear at night time, with reports of decreased pain and issues with thumb but increased finger pain/numbness Goal status: MET  5.  Pt will ind recall at least 3 edema management and/or pain management strategies. Baseline: Pt demo'ing pain and edema primarily affecting R thumb. 07/09/23: utilizing heat, massage, resting hand splint for night time, and joint protection strategies for decreased pain Goal status: MET  LONG TERM GOALS: Target date: 08/14/23  Patient will demo improved FM coordination as evidenced by completing nine-hole peg with use of  RUE in 22 seconds or less with no more than 1 drop. Baseline: 9 Hole Peg test: Right: 24 sec with x1 drop; Left: 22 sec with no drops. 07/29/23 - Right: 18 seconds with no drops; Left: 24 seconds with no drops (pt is reporting increased pain in L  hand today) Goal status: MET   2.  Patient will demonstrate at least 31 lbs RUE grip strength as needed to open jars and other containers.  Baseline: Grip strength: Right: 27, 27, 28 (27.3 lbs average) lbs; Left: 36, 39, 37 (37.3 lbs average)  lbs 07/09/23: Right: 47, 47, 42 (45.3# average) lbs. Goal status: MET   3.  Patient will demonstrate at least 16% improvement with quick Dash score (reporting 36.3% disability or less) indicating improved functional use of affected extremity.  Baseline: Quick Dash: 52.3% deficit 07/29/23 - 27.3% impairment Goal status: MET   4.  Pt will verbalize or demo understanding of A/E options and adaptive strategies for ADL/IADL tasks, including handwriting and work-related tasks, in order to decrease pain of RUE. Baseline: Pt reported pain during many functional tasks, including handwriting and many work-related tasks.  07/29/23: pt wrote 3.5 pages of notes, stopping occasionally but much improved compared to eval when having pain and needing to stop when writing sympathy card Goal status: MET   ASSESSMENT:  CLINICAL IMPRESSION: Pt reporting increased pain in LUE today, and continued numbness and tingling in R forearm but not hand and fingers.  Pt reports blood test came back normal, but she is to have upcoming allergy testing as well as nerve conduction study.  Pt reports improved positioning with sleep with decrease in numbness/tingling in RUE.   Reiterated recommendation for pt to keep a pain/symptom journal to assess changes as pt with progressive changes in fingers compared to thumb over the 2 weeks now.  Pt in agreement with d/c at this time as she has met current goals per POC, but may benefit from additional OT in the future based on results of upcoming testing.  Pt would then require new referral based on new diagnosis/symptoms.   PERFORMANCE DEFICITS: in functional skills including ADLs, IADLs, coordination, dexterity, sensation, edema, strength, pain,  flexibility, Fine motor control, Gross motor control, mobility, body mechanics, endurance, and UE functional use, cognitive skills including energy/drive, and psychosocial skills including environmental adaptation.     PLAN:  OT FREQUENCY: 2x/week  OT DURATION: 6 weeks  PLANNED INTERVENTIONS: 97168 OT Re-evaluation, 97535 self care/ADL training, 16109 therapeutic exercise, 97530 therapeutic activity, 97112 neuromuscular re-education, 97140 manual therapy, 97035 ultrasound, 97018 paraffin, 60454 fluidotherapy, 97760 Orthotic Initial, 97761 Prosthetic Initial, [id number] Orthotic/Prosthetic subsequent, passive range of motion, functional mobility training, energy conservation, patient/family education, and DME and/or AE instructions  RECOMMENDED OTHER SERVICES: n/a  CONSULTED AND AGREED WITH PLAN OF CARE: Patient   Anthonette Kinsman, OTR/L 07/29/2023, 12:25 PM   [facility] at [facility] [address] [place], [place] [address] Phone # [phone] Fax # [phone]

## 2023-07-30 ENCOUNTER — Ambulatory Visit (INDEPENDENT_AMBULATORY_CARE_PROVIDER_SITE_OTHER): Payer: Self-pay | Admitting: Allergy & Immunology

## 2023-07-30 ENCOUNTER — Other Ambulatory Visit: Payer: Self-pay | Admitting: Allergy & Immunology

## 2023-07-30 ENCOUNTER — Other Ambulatory Visit: Payer: Self-pay

## 2023-07-30 ENCOUNTER — Encounter: Payer: Self-pay | Admitting: Allergy & Immunology

## 2023-07-30 VITALS — BP 98/64 | HR 72 | Temp 98.2°F | Ht 60.24 in | Wt 122.2 lb

## 2023-07-30 DIAGNOSIS — T7840XD Allergy, unspecified, subsequent encounter: Secondary | ICD-10-CM

## 2023-07-30 DIAGNOSIS — L508 Other urticaria: Secondary | ICD-10-CM

## 2023-07-30 DIAGNOSIS — J310 Chronic rhinitis: Secondary | ICD-10-CM | POA: Diagnosis not present

## 2023-07-30 DIAGNOSIS — L239 Allergic contact dermatitis, unspecified cause: Secondary | ICD-10-CM

## 2023-07-30 NOTE — Progress Notes (Signed)
 NEW PATIENT  Date of Service/Encounter:  07/30/23  Consult requested by: Trenton Frock, PA-C   Assessment:   Chronic urticaria  Allergic reaction - no obvious trigger   Allergic contact dermatitis  Chronic rhinitis   Plan/Recommendations:   1. Chronic urticaria - with allergic reactions and generalized inflammation  - We are going to get a few more labs to see what might be causing these symptoms. - We are also going to get some urine and blood studies to look for mast cell activation syndrome.  - Mold toxicity is typically handled by naturopathic doctors like [facility] ([email or web address]). - There is not a lot of data to validate these tests, but some patients have a lot of success with this.   2. Recurrent infections - We will obtain some screening labs to evaluate your immune system.  - Labs to evaluate the quantitative [facility]) aspects of your immune system: IgG/IgA/IgM, CBC with differential - Labs to evaluate the qualitative (HOW WELL THEY WORK) aspects of your immune system: CH50, Pneumococcal titers, Tetanus titers, Diphtheria titers - We may consider immunizations with Pneumovax and Tdap to challenge your immune system, and then obtain repeat titers in 4-6 weeks.   3. Allergic contact dermatitis - I would recommend that you do patch testing to see what might be causing your skin symptoms. - This will look for sensitivities to plastics/metals/chemicals/etc. - This might help us  figure out what is causing your skin issues.  - These patches are stickers that are placed on a Monday and then read on a Wednesday and a Friday.   4. Chronic rhinitis  - We are getting labs to look for environmental allergens since we are getting so much blood work anyway.   5. Concern for food allergies - Food sheet provided. - Come back in one week for food testing. - There is a the low positive predictive value of food allergy testing and  hence the high possibility of false positives. - In contrast, food allergy testing has a high negative predictive value, therefore if testing is negative we can be relatively assured that they are indeed negative.   6. Return in about 2 months (around 09/29/2023). We can do NAC-80 patch testing.   This note in its entirety was forwarded to the Provider who requested this consultation.  Subjective:   Jashanti Clinkscale is a 53 y.o. female presenting today for evaluation of  Chief Complaint  Patient presents with   Allergies    Drug and food allergy    Geraldy Akridge has a history of the following: Patient Active Problem List   Diagnosis Date Noted   Non-alcoholic fatty liver disease 03/20/2023    History obtained from: chart review and patient.  Discussed the use of AI scribe software for clinical note transcription with the patient and/or guardian, who gave verbal consent to proceed.  Adilene Areola was referred by Trenton Frock, PA-C.     Axie is a 53 y.o. female presenting for an evaluation of a host of different complaints.      She experiences sensitivities or allergies to a wide range of substances, including medications and environmental factors. Reactions include chemical burns from dental suction tubes, severe joint pain from inhaling nightshade vegetables, and respiratory distress from cigarette smoke exposure. Symptoms do not typically include anaphylaxis, but hives occur occasionally.  She has a history of chronic bronchitis, attributed to childhood exposure to cigarette smoke from her parents. She now experiences throat tightness and difficulty breathing  when exposed to cigarette smoke, which resolves with fresh air. Facial swelling and itching also occur with cigarette smoke exposure.  A recent incident involved intense lip itching from wearing an N95 mask, which did not occur with a cloth mask. She has also experienced reactions to NSAIDs and Tylenol , with NSAIDs  causing hallucinations and Tylenol  causing extreme drowsiness.  She has a history of liver issues, including a cyst and elevated bilirubin levels, which have normalized recently. An MRCP showed an increase in her common biliary duct size from 6.8 mm to 8 mm.  She follows a vegan diet since June 2018, which alleviated her IBS symptoms. She avoids dairy and nightshade vegetables due to severe joint pain and other symptoms. A significant reaction to nightshade vegetables includes joint pain and immobility.  Recurrent thumb pain and trigger thumb are associated with infections like COVID-19. Symptoms worsen with infections, affecting her joints, particularly her hands.  She has a history of mold exposure in her home and is concerned about its impact on her health. She has been in occupational therapy for her hands and is undergoing testing for nerve conduction and ALS due to familial history.  She works as an Engineer, civil (consulting), using low-toxicity products due to her sensitivities. She avoids fragrances and uses natural cleaning products at home. She has a history of ear infections and scarring, with the last antibiotic use in February.     Otherwise, there is no history of other atopic diseases, including drug allergies, stinging insect allergies, or contact dermatitis. There is no significant infectious history. Vaccinations are up to date.    Past Medical History: Patient Active Problem List   Diagnosis Date Noted   Non-alcoholic fatty liver disease 03/20/2023    Medication List:  Allergies as of 07/30/2023       Reactions   Other Anaphylaxis   Night Shade veg-joint pain   Advair Diskus [fluticasone-salmeterol] Other (See Comments)   Visual changes   Bee Venom Swelling   Buspirone Other (See Comments)   Severe headache   Codeine Itching, Nausea And Vomiting   Fluoxetine Other (See Comments)   Felt weird   Ibuprofen Other (See Comments)   Feels like she is sedated    Nsaids Other (See Comments)   Feels sedated   Sertraline Hcl Other (See Comments)   Couldn't sleep; OCD   Latex Rash   Blisters        Medication List        Accurate as of July 30, 2023  1:38 PM. If you have any questions, ask your nurse or doctor.          B12-Active 1 MG Chew Generic drug: Methylcobalamin as directed Orally   cetirizine 10 MG chewable tablet Commonly known as: ZYRTEC Chew 10 mg by mouth daily.   nortriptyline  10 MG capsule Commonly known as: PAMELOR  Take 1 capsule (10 mg total) by mouth at bedtime.   Vitamin D3 1000 units Caps 1 capsule.        Birth History: non-contributory  Developmental History: non-contributory  Past Surgical History: Past Surgical History:  Procedure Laterality Date   BREAST EXCISIONAL BIOPSY Left 1994   BREAST SURGERY     COLONOSCOPY     SIGMOIDOSCOPY     some upper Gi test     pt states was not EGD   TYMPANOSTOMY TUBE PLACEMENT       Family History: Family History  Problem Relation Age of Onset   High blood pressure  Mother    Stroke Mother    Colon polyps Mother    Heart disease Mother    Hypertension Mother    Vision loss Mother    Eczema Father    Cancer Father        bladder; prostate   COPD Father    Hearing loss Father    Kidney disease Father    Breast cancer Paternal Aunt    Cancer Paternal Aunt    Heart disease Maternal Grandmother    Varicose Veins Maternal Grandmother    Cancer Maternal Grandfather    Kidney disease Maternal Grandfather    COPD Maternal Grandfather    Heart disease Paternal Grandmother    Early death Paternal Grandmother    Colon cancer Paternal Grandfather 67   Esophageal cancer Neg Hx    Rectal cancer Neg Hx    Stomach cancer Neg Hx      Social History: Shantara lives at home with her family.  They live in a house that is 52 years old.  There is carpeting throughout the home.  She has gas and the implant for cooling.  There is 1 dog and 1 cat in the home and 2  cats outside of the home.  There are dust mite covers on the bed, but not the pillows.  There is no tobacco exposure.  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  There is exposure to fumes, chemicals, and dust.  They do not have a HEPA filter.  They do not live near an interstate or industrial area.   Review of systems otherwise negative other than that mentioned in the HPI.    Objective:   Blood pressure 98/64, pulse 72, temperature 98.2 F (36.8 C), temperature source Temporal, height 5' 0.24" (1.53 m), weight 122 lb 3.2 oz (55.4 kg), last menstrual period 09/24/2021, SpO2 99%. Body mass index is 23.68 kg/m.     Physical Exam Vitals reviewed.  Constitutional:      Appearance: She is well-developed.     Comments: Talkative.  Anxious.  HENT:     Head: Normocephalic and atraumatic.     Right Ear: Tympanic membrane, ear canal and external ear normal. No drainage, swelling or tenderness. Tympanic membrane is not injected, scarred, erythematous, retracted or bulging.     Left Ear: Tympanic membrane, ear canal and external ear normal. No drainage, swelling or tenderness. Tympanic membrane is not injected, scarred, erythematous, retracted or bulging.     Nose: No nasal deformity, septal deviation, mucosal edema or rhinorrhea.     Right Turbinates: Enlarged, swollen and pale.     Left Turbinates: Enlarged, swollen and pale.     Right Sinus: No maxillary sinus tenderness or frontal sinus tenderness.     Left Sinus: No maxillary sinus tenderness or frontal sinus tenderness.     Comments: No polyps.     Mouth/Throat:     Mouth: Mucous membranes are not pale and not dry.     Pharynx: Uvula midline.  Eyes:     General:        Right eye: No discharge.        Left eye: No discharge.     Conjunctiva/sclera: Conjunctivae normal.     Right eye: Right conjunctiva is not injected. No chemosis.    Left eye: Left conjunctiva is not injected. No chemosis.     Pupils: Pupils are equal, round, and reactive to light.  Cardiovascular:  Rate and Rhythm: Normal rate and regular rhythm.     Heart sounds: Normal heart sounds.  Pulmonary:     Effort: Pulmonary effort is normal. No tachypnea, accessory muscle usage or respiratory distress.     Breath sounds: Normal breath sounds. No wheezing, rhonchi or rales.  Chest:     Chest wall: No tenderness.  Abdominal:     Tenderness: There is no abdominal tenderness. There is no guarding or rebound.  Lymphadenopathy:     Head:     Right side of head: No submandibular, tonsillar or occipital adenopathy.     Left side of head: No submandibular, tonsillar or occipital adenopathy.     Cervical: No cervical adenopathy.  Skin:    Coloration: Skin is not pale.     Findings: No abrasion, erythema, petechiae or rash. Rash is not papular, urticarial or vesicular.  Neurological:     Mental Status: She is alert.  Psychiatric:        Behavior: Behavior is cooperative.      Diagnostic studies: labs sent instead        Drexel Gentles, MD [facility] 

## 2023-07-30 NOTE — Patient Instructions (Addendum)
 1. Chronic urticaria - with allergic reactions and generalized inflammation  - We are going to get a few more labs to see what might be causing these symptoms. - We are also going to get some urine and blood studies to look for mast cell activation syndrome.  - Mold toxicity is typically handled by naturopathic doctors like [facility] ([email or web address]). - There is not a lot of data to validate these tests, but some patients have a lot of success with this.   2. Recurrent infections - We will obtain some screening labs to evaluate your immune system.  - Labs to evaluate the quantitative [facility]) aspects of your immune system: IgG/IgA/IgM, CBC with differential - Labs to evaluate the qualitative (HOW WELL THEY WORK) aspects of your immune system: CH50, Pneumococcal titers, Tetanus titers, Diphtheria titers - We may consider immunizations with Pneumovax and Tdap to challenge your immune system, and then obtain repeat titers in 4-6 weeks.   3. Allergic contact dermatitis - I would recommend that you do patch testing to see what might be causing your skin symptoms. - This will look for sensitivities to plastics/metals/chemicals/etc. - This might help us  figure out what is causing your skin issues.  - These patches are stickers that are placed on a Monday and then read on a Wednesday and a Friday.   4. Chronic rhinitis  - We are getting labs to look for environmental allergens since we are getting so much blood work anyway.   5. Concern for food allergies - Food sheet provided. - Come back in one week for food testing. - There is a the low positive predictive value of food allergy testing and hence the high possibility of false positives. - In contrast, food allergy testing has a high negative predictive value, therefore if testing is negative we can be relatively assured that they are indeed negative.   6. Return in about 2 months (around 09/29/2023).  We can do NAC-80 patch testing.    Please inform us  of any Emergency Department visits, hospitalizations, or changes in symptoms. Call us  before going to the ED for breathing or allergy symptoms since we might be able to fit you in for a sick visit. Feel free to contact us  anytime with any questions, problems, or concerns.  It was a pleasure to meet you today!  Websites that have reliable patient information: 1. American Academy of Asthma, Allergy, and Immunology: www.aaaai.org 2. Food Allergy Research and Education (FARE): foodallergy.org 3. Mothers of Asthmatics: http://www.asthmacommunitynetwork.org 4. American College of Allergy, Asthma, and Immunology: www.acaai.org      "Like" us  on Facebook and Instagram for our latest updates!      A healthy democracy works best when [employer] participate! Make sure you are registered to vote! If you have moved or changed any of your contact information, you will need to get this updated before voting! Scan the QR codes below to learn more!

## 2023-07-31 ENCOUNTER — Encounter: Payer: Self-pay | Admitting: Physician Assistant

## 2023-08-03 ENCOUNTER — Telehealth: Payer: Self-pay

## 2023-08-03 ENCOUNTER — Other Ambulatory Visit: Payer: Self-pay | Admitting: Allergy & Immunology

## 2023-08-03 LAB — ALLERGENS W/COMP RFLX AREA 2
Alternaria Alternata IgE: <0.1 kU/L
Aspergillus Fumigatus IgE: <0.1 kU/L
Bermuda Grass IgE: <0.1 kU/L
Cedar, Mountain IgE: <0.1 kU/L
Cladosporium Herbarum IgE: <0.1 kU/L
Cockroach, German IgE: 0.14 kU/L — AB
Common Silver Birch IgE: <0.1 kU/L
Cottonwood IgE: <0.1 kU/L
D Farinae IgE: 0.2 kU/L — AB
D Pteronyssinus IgE: 0.18 kU/L — AB
E001-IgE Cat Dander: <0.1 kU/L
E005-IgE Dog Dander: <0.1 kU/L
Elm, American IgE: <0.1 kU/L
IgE (Immunoglobulin E), Serum: 304 [IU]/mL (ref 6–495)
Johnson Grass IgE: <0.1 kU/L
Maple/Box Elder IgE: <0.1 kU/L
Mouse Urine IgE: <0.1 kU/L
Oak, White IgE: <0.1 kU/L
Pecan, Hickory IgE: 0.53 kU/L — AB
Penicillium Chrysogen IgE: <0.1 kU/L
Pigweed, Rough IgE: <0.1 kU/L
Ragweed, Short IgE: <0.1 kU/L
Sheep Sorrel IgE Qn: <0.1 kU/L
Timothy Grass IgE: <0.1 kU/L
White Mulberry IgE: <0.1 kU/L

## 2023-08-03 LAB — ALLERGEN PROFILE, MOLD
Aureobasidi Pullulans IgE: <0.1 kU/L
Candida Albicans IgE: <0.1 kU/L
M009-IgE Fusarium proliferatum: <0.1 kU/L
M014-IgE Epicoccum purpur: <0.1 kU/L
Mucor Racemosus IgE: <0.1 kU/L
Phoma Betae IgE: <0.1 kU/L
Setomelanomma Rostrat: <0.1 kU/L
Stemphylium Herbarum IgE: <0.1 kU/L

## 2023-08-03 NOTE — Telephone Encounter (Signed)
 Received [facility] fax - place in provider's in basket to review.  Forwarding message to provider as well

## 2023-08-03 NOTE — Telephone Encounter (Signed)
 Jill Melendez/[facility] called in - DOB verified - stated [facility] has contacted them advising the blood work received has exceeded the time in order to process testing.  [facility] is requesting to either continue process with notation or discontinue, the office will obtain new specimen for testing.  Jill Melendez was given our fax number to fax [facility] 's notation - will give to provider to review/make decision.  Jill Melendez verbalized understanding to all, no further questions.  Forwarding message to provider.

## 2023-08-03 NOTE — Addendum Note (Signed)
 Addended by: Rochester Chuck on: 08/03/2023 01:03 PM   Modules accepted: Orders

## 2023-08-03 NOTE — Addendum Note (Signed)
 Addended by: Rochester Chuck on: 08/03/2023 04:17 PM   Modules accepted: Orders

## 2023-08-05 ENCOUNTER — Ambulatory Visit: Admitting: Physician Assistant

## 2023-08-06 ENCOUNTER — Ambulatory Visit (HOSPITAL_BASED_OUTPATIENT_CLINIC_OR_DEPARTMENT_OTHER)

## 2023-08-06 ENCOUNTER — Encounter (HOSPITAL_BASED_OUTPATIENT_CLINIC_OR_DEPARTMENT_OTHER): Payer: Self-pay | Admitting: Student

## 2023-08-06 ENCOUNTER — Ambulatory Visit (HOSPITAL_BASED_OUTPATIENT_CLINIC_OR_DEPARTMENT_OTHER): Admitting: Student

## 2023-08-06 ENCOUNTER — Other Ambulatory Visit (HOSPITAL_BASED_OUTPATIENT_CLINIC_OR_DEPARTMENT_OTHER): Payer: Self-pay

## 2023-08-06 DIAGNOSIS — M25511 Pain in right shoulder: Secondary | ICD-10-CM

## 2023-08-06 LAB — B CELL SUBSET ANALYSIS

## 2023-08-06 LAB — OTHER LAB TEST

## 2023-08-06 LAB — ALPHA-GAL PANEL
Allergen Lamb IgE: <0.1 kU/L
Beef IgE: <0.1 kU/L
IgE (Immunoglobulin E), Serum: 303 [IU]/mL (ref 6–495)
O215-IgE Alpha-Gal: <0.1 kU/L
Pork IgE: <0.1 kU/L

## 2023-08-06 LAB — SPECIMEN STATUS REPORT

## 2023-08-06 MED ORDER — METHYLPREDNISOLONE 4 MG PO TBPK
ORAL_TABLET | ORAL | 0 refills | Status: DC
  Filled 2023-08-06: qty 21, 6d supply, fill #0

## 2023-08-06 NOTE — Progress Notes (Signed)
 Chief Complaint: Right shoulder pain     History of Present Illness:    Jill Melendez is a 53 y.o. female presenting with acute right shoulder pain.  She denies any recent injury, however woke up this morning in excruciating pain which was described as a tearing sensation, with little ability to move her right arm and numbness in the right hand.  States that pain is most significant in the top of the right shoulder although pain, numbness, and tingling are radiating down the right arm.  She does have swelling in the hand and fingers.  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  Patient has an extensive allergy history and is currently being worked up further for this.  She has also been referred for a nerve conduction study but has had not had this completed yet.   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   Influenza due to influenza virus, type B 04/12/2014   Migraine    Sepsis (HCC) 04/11/2014   Past Surgical History:  Procedure Laterality Date   BREAST EXCISIONAL BIOPSY Left 1994   BREAST SURGERY     COLONOSCOPY     SIGMOIDOSCOPY     some upper Gi test     pt states was not EGD   TYMPANOSTOMY TUBE PLACEMENT     Social History   Socioeconomic History   Marital status: Significant Other    Spouse name: Lavonia Powers    Number of children: 0   Years of education: GED   Highest education level: GED or equivalent  Occupational History   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 Stopped drinking any caffeine in 2014.    No alcohol tobacco or drug use   Social Drivers of Corporate investment banker Strain: Low Risk  (06/15/2023)   Overall Financial Resource Strain (CARDIA)    Difficulty of Paying Living Expenses: Not hard at all  Food Insecurity: No Food Insecurity (06/15/2023)   Hunger Vital Sign    Worried About Running Out of Food in the Last Year: Never true    Ran Out of Food in the Last Year: Never true  Transportation Needs: No Transportation Needs (06/15/2023)   PRAPARE - Administrator, Civil Service (Medical): No    Lack of Transportation (Non-Medical): No  Physical Activity: Insufficiently Active (06/15/2023)   Exercise Vital Sign    Days of Exercise per Week:  2 days    Minutes of Exercise per Session: 40 min  Stress: No Stress Concern Present (06/15/2023)   [employer] of Occupational Health - Occupational Stress Questionnaire    Feeling of Stress : Not at all  Social Connections: Moderately Integrated (06/15/2023)   Social Connection and Isolation Panel    Frequency of Communication with Friends and Family: More than three times a week    Frequency of Social Gatherings with Friends and Family: Once a week    Attends Religious Services: 1 to 4 times per year    Active Member of [employer] or Organizations: No    Attends Engineer, structural: Never    Marital Status: Married   Family History  Problem Relation Age of Onset   High blood pressure Mother    Stroke Mother    Colon polyps Mother    Heart disease Mother    Hypertension Mother    Vision loss Mother    Eczema Father     Cancer Father        bladder; prostate   COPD Father    Hearing loss Father    Kidney disease Father    Breast cancer Paternal Aunt    Cancer Paternal Aunt    Heart disease Maternal Grandmother    Varicose Veins Maternal Grandmother    Cancer Maternal Grandfather    Kidney disease Maternal Grandfather    COPD Maternal Grandfather    Heart disease Paternal Grandmother    Early death Paternal Grandmother    Colon cancer Paternal Grandfather 65   Esophageal cancer Neg Hx    Rectal cancer Neg Hx    Stomach cancer Neg Hx    Allergies  Allergen Reactions   Other Anaphylaxis    Night Shade veg-joint pain   Advair Diskus [Fluticasone-Salmeterol] Other (See Comments)    Visual changes    Bee Venom Swelling   Buspirone Other (See Comments)    Severe headache   Codeine Itching and Nausea And Vomiting   Fluoxetine Other (See Comments)    Felt weird   Ibuprofen Other (See Comments)    Feels like she is sedated   Nsaids Other (See Comments)    Feels sedated   Sertraline Hcl Other (See Comments)    Couldn't sleep; OCD   Latex Rash    Blisters    Current Outpatient Medications  Medication Sig Dispense Refill   methylPREDNISolone  (MEDROL  DOSEPAK) 4 MG TBPK tablet Take 6 tablets by mouth on day 1, 5 tabs on day 2, 4 tabs on day 3, 3 tabs on day 4, 2 tabs on day 5, 1 tab on day 6. Then stop. 21 tablet 0   Cholecalciferol (VITAMIN D3) 1000 units CAPS 1 capsule.     Methylcobalamin (B12-ACTIVE) 1 MG CHEW as directed Orally     No current facility-administered medications for this visit.   No results found.  Review of Systems:   A ROS was performed including pertinent positives and negatives as documented in the HPI.  Physical Exam :   Constitutional: NAD and appears stated age Neurological: Alert and oriented Psych: Appropriate affect and cooperative Last menstrual period 09/24/2021.   Comprehensive Musculoskeletal Exam:    No significant cervical or paraspinal muscles  tenderness. Pain and decreased ROM with cerivcal rotation and sidebend to the right. Positive Spurling's. No tenderness over the anterior or posterior glenohumeral joint. Active shoulder ROM to 150 flexion, 30 ER, and IR to L5. Fingers in the right hand appear swollen.  Radial pulse 2+.  Imaging:   Xray (cervical spine 4 views, right shoulder 3 views): Mild degenerative changes at the C5-C6 and C6-C7 levels with decreased disc spacing and anterior osteophyte formation.  Normal appearing right shoulder radiographs.   I personally reviewed and interpreted the radiographs.   Assessment:   53 y.o. female presenting today with acute right shoulder pain as well as numbness and tingling radiating down the right arm.  She denies any significant neck pain, however this does seem most consistent with acute cervical radiculopathy given the distribution of symptoms.  Not sure that this fully explains the swelling in her fingers, however this could be more allergy related but may consider thoracic outlet still in the differential.  Low suspicion at this time for adhesive capsulitis.  She is unable to take NSAIDs due to an allergy, therefore I would like to start her on a Medrol  Dosepak today to help control symptoms.  Would recommend that she follows through with the nerve conduction study, and patient may see if she is able to get this scheduled sooner.  She is followed by neurology who may be able to advise on results of this, however we will be happy to see her back as needed.   Plan :    - Start Medrol  Dosepak and return to clinic as needed     I personally saw and evaluated the patient, and participated in the management and treatment plan.  Sharrell Deck, PA-C Orthopedics

## 2023-08-07 ENCOUNTER — Ambulatory Visit: Payer: Self-pay | Admitting: Allergy & Immunology

## 2023-08-10 ENCOUNTER — Encounter (HOSPITAL_BASED_OUTPATIENT_CLINIC_OR_DEPARTMENT_OTHER): Payer: Self-pay

## 2023-08-10 ENCOUNTER — Telehealth: Payer: Self-pay | Admitting: Allergy & Immunology

## 2023-08-10 NOTE — Telephone Encounter (Signed)
 Jill Melendez called and stated that she has been trying to get in touch with you since last Tuesday. She wants to know if for the B-cell subset Analysis, do you want to procede with disclaimer for testing. Jill Melendez is requesting a call back regarding this. Her call back number is [phone].

## 2023-08-11 LAB — N-METHYLHISTAMINE, 24 HR, U
Collection Duration (h): 24 h
Creatinine Concent. 24 Hr, U: 33 mg/dL
Creatinine, 24 Hour, U: 891 mg/(24.h) (ref 603–1783)
N-Methylhistamine, 24 Hr, U: 168 ug/g{creat} (ref 30–200)
Urine Volume (mL): 2700 mL

## 2023-08-11 LAB — B CELL SUBSET ANALYSIS

## 2023-08-11 LAB — STREP PNEUMONIAE 23 SEROTYPES IGG
Pneumo Ab Type 1*: <0.1 ug/mL — ABNORMAL LOW (ref >1.3)
Pneumo Ab Type 12 (12F)*: <0.1 ug/mL — ABNORMAL LOW (ref >1.3)
Pneumo Ab Type 14*: 0.5 ug/mL — ABNORMAL LOW (ref >1.3)
Pneumo Ab Type 17 (17F)*: <0.1 ug/mL — ABNORMAL LOW (ref >1.3)
Pneumo Ab Type 19 (19F)*: <0.1 ug/mL — ABNORMAL LOW (ref >1.3)
Pneumo Ab Type 2*: <0.2 ug/mL — ABNORMAL LOW (ref >1.3)
Pneumo Ab Type 20*: 2.5 ug/mL (ref >1.3)
Pneumo Ab Type 22 (22F)*: <0.1 ug/mL — ABNORMAL LOW (ref >1.3)
Pneumo Ab Type 23 (23F)*: <0.1 ug/mL — ABNORMAL LOW (ref >1.3)
Pneumo Ab Type 26 (6B)*: <0.1 ug/mL — ABNORMAL LOW (ref >1.3)
Pneumo Ab Type 3*: 0.1 ug/mL — ABNORMAL LOW (ref >1.3)
Pneumo Ab Type 34 (10A)*: <0.1 ug/mL — ABNORMAL LOW (ref >1.3)
Pneumo Ab Type 4*: <0.1 ug/mL — ABNORMAL LOW (ref >1.3)
Pneumo Ab Type 43 (11A)*: 0.2 ug/mL — ABNORMAL LOW (ref >1.3)
Pneumo Ab Type 5*: <0.1 ug/mL — ABNORMAL LOW (ref >1.3)
Pneumo Ab Type 51 (7F)*: 1.2 ug/mL — ABNORMAL LOW (ref >1.3)
Pneumo Ab Type 54 (15B)*: <0.2 ug/mL — ABNORMAL LOW (ref >1.3)
Pneumo Ab Type 56 (18C)*: <0.1 ug/mL — ABNORMAL LOW (ref >1.3)
Pneumo Ab Type 57 (19A)*: 1 ug/mL — ABNORMAL LOW (ref >1.3)
Pneumo Ab Type 68 (9V)*: 0.2 ug/mL — ABNORMAL LOW (ref >1.3)
Pneumo Ab Type 70 (33F)*: <0.1 ug/mL — ABNORMAL LOW (ref >1.3)
Pneumo Ab Type 8*: <0.3 ug/mL — ABNORMAL LOW (ref >1.3)
Pneumo Ab Type 9 (9N)*: <0.1 ug/mL — ABNORMAL LOW (ref >1.3)

## 2023-08-11 LAB — IGG, IGA, IGM
IgA/Immunoglobulin A, Serum: 206 mg/dL (ref 87–352)
IgG (Immunoglobin G), Serum: 1135 mg/dL (ref 586–1602)
IgM (Immunoglobulin M), Srm: 75 mg/dL (ref 26–217)

## 2023-08-11 LAB — LEUKOTRIENE E4, 24 HR, U
Collection Duration: 24 h
Creatinine Concentration,24 HR: 33 mg/dL
Creatinine, 24 HR, U: 891 mg/(24.h) (ref 603–1783)
Leukotriene E4, U: 85 pg/mg{creat} (ref <=104)
Urine Volume: 2700 mL

## 2023-08-11 LAB — COMPLEMENT, TOTAL: Compl, Total (CH50): >60 U/mL (ref >41)

## 2023-08-11 LAB — DIPHTHERIA / TETANUS ANTIBODY PANEL
Diphtheria Ab: 0.24 [IU]/mL (ref <0.10)
Tetanus Ab, IgG: 5.18 [IU]/mL (ref <0.10)

## 2023-08-11 LAB — PROSTAGLANDIN D2/CREATININE, U
Creatinine, Urine: 55 mg/dL
Prostaglandin D2, urine: 5.8 pg/mL
Prostaglandin D2/Cr Ratio: 11 ng/g

## 2023-08-11 LAB — THYROID ANTIBODIES (THYROPEROXIDASE & THYROGLOBULIN)
Thyroglobulin Antibody: <1 [IU]/mL (ref 0.0–0.9)
Thyroperoxidase Ab SerPl-aCnc: <9 [IU]/mL (ref 0–34)

## 2023-08-11 LAB — CHRONIC URTICARIA PD-BAT: Pooled Donor- BAT CU: 4.1 % (ref 0.00–10.60)

## 2023-08-11 LAB — PROSTAGLANDIN D2, SERUM: Prostaglandin D2, serum: 14 pg/mL

## 2023-08-11 LAB — TRYPTASE: Tryptase: 4.8 ug/L (ref 2.2–13.2)

## 2023-08-11 NOTE — Telephone Encounter (Signed)
 Refer to 08/03/23 telephone encounter.

## 2023-08-12 ENCOUNTER — Ambulatory Visit (HOSPITAL_BASED_OUTPATIENT_CLINIC_OR_DEPARTMENT_OTHER): Admitting: Student

## 2023-08-12 ENCOUNTER — Encounter (HOSPITAL_BASED_OUTPATIENT_CLINIC_OR_DEPARTMENT_OTHER): Payer: Self-pay | Admitting: Student

## 2023-08-12 DIAGNOSIS — M25511 Pain in right shoulder: Secondary | ICD-10-CM

## 2023-08-12 NOTE — Progress Notes (Signed)
 Chief Complaint: Right shoulder pain    Discussed the use of AI scribe software for clinical note transcription with the patient, who gave verbal consent to proceed.  History of Present Illness Jill Melendez is a 53 year old female who presents with right shoulder pain.  Pain is primarily located on the top and front of the right shoulder. It worsens with arm lifting or backward movement and is accompanied by popping and tearing sensations. Cracking sounds are present in the shoulder. Swelling in the hand has decreased but persists, and numbness in the hands, particularly the middle finger, has improved with steroid use. Pain occurs when lifting the arm, especially when reaching forward, leading her to use her left hand more. Tension is noted in the deltoid area, with a sensation of being shocked traveling down the palm into the middle finger. Occasional pain is noted on the outer edge of the elbow. Her work involves constant motion and repetitive movements, contributing to shoulder pain.   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   Influenza due to influenza virus, type B 04/12/2014   Migraine    Sepsis (HCC) 04/11/2014   Past Surgical History:  Procedure Laterality Date   BREAST EXCISIONAL BIOPSY Left 1994   BREAST SURGERY     COLONOSCOPY     SIGMOIDOSCOPY     some upper Gi test     pt states was not EGD   TYMPANOSTOMY TUBE PLACEMENT     Social History   Socioeconomic History   Marital status: Significant Other    Spouse name: Lavonia Powers    Number of children: 0   Years of education: GED   Highest education level: GED or equivalent  Occupational History   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 Stopped drinking any caffeine in 2014.    No alcohol tobacco or drug use   Social Drivers of Corporate investment banker Strain: Low Risk  (06/15/2023)   Overall Financial Resource Strain (CARDIA)    Difficulty of Paying Living Expenses: Not hard at all  Food Insecurity: No Food Insecurity (06/15/2023)   Hunger Vital Sign    Worried About Running Out of Food in the Last Year: Never true    Ran Out of Food in the Last Year: Never true  Transportation Needs: No Transportation Needs (06/15/2023)   PRAPARE - Administrator, Civil Service (Medical): No    Lack of Transportation (Non-Medical): No  Physical Activity: Insufficiently Active (06/15/2023)   Exercise Vital Sign    Days of Exercise per Week: 2 days    Minutes of Exercise per Session: 40 min  Stress: No Stress Concern Present (06/15/2023)   [employer] of Occupational Health - Occupational Stress Questionnaire    Feeling of Stress : Not at all  Social Connections: Moderately Integrated (06/15/2023)   Social Connection and Isolation Panel    Frequency of Communication with Friends and Family: More than three times a week    Frequency of Social Gatherings with Friends and Family: Once a week    Attends Religious Services: 1 to 4 times per year    Active Member of [employer] or Organizations: No    Attends Engineer, structural: Never    Marital Status: Married   Family History  Problem Relation Age of  Onset   High blood pressure Mother    Stroke Mother    Colon polyps Mother    Heart disease Mother    Hypertension Mother    Vision loss Mother    Eczema Father    Cancer Father        bladder; prostate   COPD Father    Hearing loss Father    Kidney disease Father    Breast cancer Paternal Aunt    Cancer Paternal Aunt    Heart disease Maternal Grandmother    Varicose Veins Maternal Grandmother    Cancer Maternal Grandfather    Kidney disease Maternal Grandfather    COPD Maternal Grandfather    Heart disease Paternal Grandmother    Early death Paternal Grandmother    Colon cancer Paternal Grandfather 19   Esophageal cancer Neg Hx    Rectal cancer Neg Hx    Stomach cancer Neg Hx    Allergies  Allergen Reactions   Other Anaphylaxis    Night Shade veg-joint pain   Advair Diskus [Fluticasone-Salmeterol] Other (See Comments)    Visual changes    Bee Venom Swelling   Buspirone Other (See Comments)    Severe headache   Codeine Itching and Nausea And Vomiting   Fluoxetine Other (See Comments)    Felt weird   Ibuprofen Other (See Comments)    Feels like she is sedated   Nsaids Other (See Comments)    Feels sedated   Sertraline Hcl Other (See Comments)    Couldn't sleep; OCD   Latex Rash    Blisters    Current Outpatient Medications  Medication Sig Dispense Refill   Cholecalciferol (VITAMIN D3) 1000 units CAPS 1 capsule.     Methylcobalamin (B12-ACTIVE) 1 MG CHEW as directed Orally     methylPREDNISolone  (MEDROL  DOSEPAK) 4 MG TBPK tablet Take 6 tablets by mouth on day 1, 5 tabs on day 2, 4 tabs on day 3, 3 tabs on day 4, 2 tabs on day 5, 1 tab on day 6. Then stop. 21 tablet 0   No current facility-administered medications for this visit.   No results found.  Review of Systems:   A ROS was performed including pertinent positives and negatives as documented in the HPI.  Physical Exam :   Constitutional: NAD and appears stated age Neurological: Alert and  oriented Psych: Appropriate affect and cooperative Last menstrual period 09/24/2021.   Comprehensive Musculoskeletal Exam:    Tenderness of the left shoulder over the anterior aspect of the deltoid and left scapular area.  Active bilateral shoulder range of motion is to 160 degrees forward flexion, 40 degrees  external rotation, and internal rotation L5.  Negative empty can, impingement tests, liftoff test, O'Brien, and speeds.  Positive Tinel's test at the wrist.  Imaging:       Assessment & Plan Muscle strain of right shoulder   Patient experienced a ripping and tearing sensation in the front of the right shoulder a few days ago.  Overall her exam is reassuring as range of motion and strength is well-preserved.  She does experience tenderness over the anterior aspect of the deltoid and pain with resisted shoulder abduction, suggesting a likely deltoid strain.  Recommend continuing with conservative therapies including ice, heat, muscle relaxers, massage, and stretching.   Cervical radiculopathy   Continued, suspected cervical radiculopathy is indicated by numbness, tingling in the hands, and shooting arm pain. Symptoms have improved with steroids. A nerve study is planned in early July to differentiate radiculopathy from potential carpal tunnel syndrome.  Will plan to follow-up on this as needed.      I personally saw and evaluated the patient, and participated in the management and treatment plan.  Sharrell Deck, PA-C Orthopedics

## 2023-08-13 ENCOUNTER — Ambulatory Visit (INDEPENDENT_AMBULATORY_CARE_PROVIDER_SITE_OTHER): Admitting: Allergy & Immunology

## 2023-08-13 ENCOUNTER — Encounter: Payer: Self-pay | Admitting: Allergy & Immunology

## 2023-08-13 DIAGNOSIS — L508 Other urticaria: Secondary | ICD-10-CM

## 2023-08-13 DIAGNOSIS — J302 Other seasonal allergic rhinitis: Secondary | ICD-10-CM

## 2023-08-13 DIAGNOSIS — J3089 Other allergic rhinitis: Secondary | ICD-10-CM | POA: Diagnosis not present

## 2023-08-13 DIAGNOSIS — T7840XD Allergy, unspecified, subsequent encounter: Secondary | ICD-10-CM | POA: Diagnosis not present

## 2023-08-13 DIAGNOSIS — B999 Unspecified infectious disease: Secondary | ICD-10-CM

## 2023-08-13 DIAGNOSIS — R52 Pain, unspecified: Secondary | ICD-10-CM

## 2023-08-13 NOTE — Progress Notes (Signed)
 FOLLOW UP  Date of Service/Encounter:  08/13/23   Assessment:   Chronic urticaria - with negative workup for mast cell activation syndrome   Allergic reaction - no obvious trigger    Allergic contact dermatitis - scheduled for patch testing in July   Perennial and seasonal allergic rhinitis (grasses, weeds, trees, mold, cockroach, dust mites)  Recurrent infections - with inadequate protection against Streptococcus pneumonia  Generalized inflammation with pan positive review of systems - patient requested EBV titers which were drawn today  Concern for food allergies - with negative testing to the entire food panel  Plan/Recommendations:   1. Chronic urticaria - with allergic reactions and generalized inflammation  - All of the labs we did to look for mast cell activation syndrome were normal.  - This is good news at least!  - Consider going to see [facility] ([email or web address]). - EBV labs sent today, per your request.   2. Recurrent infections - You were not protective against Streptococcus pneumonia, but everything else was normal. - Consider getting a Pneumovax or a Prevnar 20 and get repeat titers 4-6 weeks after this. - I understand your concerns with the vaccines, however.  3. Allergic contact dermatitis - Consider doing patch testing to see what might be causing your skin symptoms. - This will look for sensitivities to plastics/metals/chemicals/etc. - This might help us  figure out what is causing your skin issues.  - These patches are stickers that are placed on a Monday and then read on a Wednesday and a Friday.   4. Chronic rhinitis  - Labs were positive to cock roach and dust mites and pecan tree. - Intradermal testing was positive to grasses, weeds, trees and mold.  - Avoidance measures and results provided.   5. Concern for food allergies - Testing to the entire panel of 72 foods was negative. - There is a the low  positive predictive value of food allergy testing and hence the high possibility of false positives. - In contrast, food allergy testing has a high negative predictive value, therefore if testing is negative we can be relatively assured that they are indeed negative.  - Copy of testing results provided.   6. Return in about 3 months (around 11/13/2023).    Subjective:   Jill Melendez is a 53 y.o. female presenting today for follow up of No chief complaint on file.   Jill Melendez has a history of the following: Patient Active Problem List   Diagnosis Date Noted   Non-alcoholic fatty liver disease 03/20/2023    History obtained from: chart review and patient.  Discussed the use of AI scribe software for clinical note transcription with the patient and/or guardian, who gave verbal consent to proceed.  Jill Melendez is a 53 y.o. female presenting for skin testing. She was last seen on June 5th. We could not do testing because her insurance company does not cover testing on the same day as a New Patient visit. She has been off of all antihistamines 3 days in anticipation of the testing.   At the last visit, there was concern with generalized inflammation and urticaria, so we got some labs to look for mast cell activation syndrome.  She was also concerned with mold toxicity, but we recommended that she talk with an integrative medicine practice instead.  For her recurrent infections, we obtain labs to look at her immune system.  For her contact dermatitis, we recommended patch testing.  For her rhinitis, we obtained an environmental allergy  panel via the blood which was positive only to pecan and cockroach and dust mite.  We also gave her a food sheet so she could circle what she was concerned with.  Her immune workup was notable for inadequate protection against Streptococcus pneumonia.  She was only protective to 1 out of 23 serotypes.  The remainder of her labs were completely negative. Her immune  workup showed low streptococcus pneumonia titers, although she is protective against tetanus and diphtheria. She is nervous about vaccines due to past joint reactions.  She has a history of environmental allergies, with prior allergy testing showing positive results for dust mites and cockroaches. She is considering more sensitive intradermal testing for further evaluation, particularly for mold, which is a concern for her.  She experiences severe pain in her arm and shoulder, which began last week. She is concerned about potential neurological issues, mentioning ALS as a worry, and is scheduled to see a neurologist in late July.  She reports issues with her liver, including a cyst and a dilated biliary duct. She has been researching various conditions and is curious about the Brigido Canales virus due to its association with multiple symptoms she is experiencing.  No significant itching, noting only a little on her left side. She does not report frequent severe illnesses.  Otherwise, there have been no changes to her past medical history, surgical history, family history, or social history.    Review of systems otherwise negative other than that mentioned in the HPI.    Objective:   Last menstrual period 09/24/2021. There is no height or weight on file to calculate BMI.    Physical exam deferred since this was a skin testing appointment only.   Diagnostic studies:   Allergy Studies:     Intradermal - 08/13/23 1012     Time Antigen Placed 1000    Allergen Manufacturer Greer    Location Arm    Number of Test 14    Control Negative    [place] 2+    [place] 2+    Johnson Negative    7 Grass Negative    Ragweed Mix Negative    Weed Mix 2+    Tree Mix 1+    Mold 1 Negative    Mold 2 Negative    Mold 3 2+    Mold 4 2+    Cat Negative    Dog Negative          Food Adult Perc - 08/13/23 0900     Number of allergen test 72    1. Peanut Negative    2. Soybean Negative     3. Wheat Negative    4. Sesame Negative    5. Milk, Cow Negative    6. Casein Negative    7. Egg White, Chicken Negative    8. Shellfish Mix Negative    9. Fish Mix Negative    10. Cashew Negative    11. Walnut Food Negative    12. Almond Negative    13. Hazelnut Negative    14. Pecan Food Negative    15. Pistachio Negative    16. [place] Nut Negative    17. Coconut Negative    18. Trout Negative    19. Tuna Negative    20. Salmon Negative    21. Flounder Negative    22. Codfish Negative    23. Shrimp Negative    24. Crab Negative    25. Lobster Negative    26.  Oyster Negative    27. Scallops Negative    28. Oat  Negative    29. Rice Negative    30. Barley Negative    31. Rye  Negative    32. Hops Negative    33. [place] Meat Negative    34. Chicken Meat Negative    35. Pork Negative    36. Beef Negative    37. Lamb Negative    38. Tomato Negative    39. White Potato Negative    40. Sweet Potato Negative    41. Pea, Green/English Negative    42. Navy Bean Negative    43. Green Beans Negative    44. Squash Negative    45. Green Pepper Negative    46. Mushrooms Negative    47. Onion Negative    48. Avocado Negative    49. Cabbage Negative    50. Carrots Negative    51. Celery Negative    52. Corn Negative    53. Cucumber Negative    54. Grape (White seedless) Negative    55. Orange  Negative    56. Lemon Negative    57. Banana Negative    58. Apple Negative    59. Peach Negative    60. Strawberry Negative    61. Blueberry Negative    62. Cherry Negative    63. Cantaloupe Negative    64. Watermelon Negative    65. Pineapple Negative    66. Chocolate/Cacao Bean Negative    67. Cinnamon Negative    68. Nutmeg Negative    69. Ginger Negative    70. Garlic Negative    71. Pepper, Black Negative    72. Mustard Negative          Allergy testing results were read and interpreted by myself, documented by clinical staff.      Drexel Gentles, MD   [facility] 

## 2023-08-13 NOTE — Patient Instructions (Addendum)
 1. Chronic urticaria - with allergic reactions and generalized inflammation  - All of the labs we did to look for mast cell activation syndrome were normal.  - This is good news at least!  - Consider going to see [facility] ([email or web address]). - EBV labs sent today, per your request.   2. Recurrent infections - You were not protective against Streptococcus pneumonia, but everything else was normal. - Consider getting a Pneumovax or a Prevnar 20 and get repeat titers 4-6 weeks after this. - I understand your concerns with the vaccines, however.  3. Allergic contact dermatitis - Consider doing patch testing to see what might be causing your skin symptoms. - This will look for sensitivities to plastics/metals/chemicals/etc. - This might help us  figure out what is causing your skin issues.  - These patches are stickers that are placed on a Monday and then read on a Wednesday and a Friday.   4. Chronic rhinitis  - Labs were positive to cock roach and dust mites and pecan tree. - Intradermal testing was positive to grasses, weeds, trees and mold.  - Avoidance measures and results provided.   5. Concern for food allergies - Testing to the entire panel of 72 foods was negative. - There is a the low positive predictive value of food allergy testing and hence the high possibility of false positives. - In contrast, food allergy testing has a high negative predictive value, therefore if testing is negative we can be relatively assured that they are indeed negative.  - Copy of testing results provided.   6. Return in about 3 months (around 11/13/2023).    Please inform us  of any Emergency Department visits, hospitalizations, or changes in symptoms. Call us  before going to the ED for breathing or allergy symptoms since we might be able to fit you in for a sick visit. Feel free to contact us  anytime with any questions, problems, or concerns.  It was a  pleasure to see you again today!  Websites that have reliable patient information: 1. American Academy of Asthma, Allergy, and Immunology: www.aaaai.org 2. Food Allergy Research and Education (FARE): foodallergy.org 3. Mothers of Asthmatics: http://www.asthmacommunitynetwork.org 4. American College of Allergy, Asthma, and Immunology: www.acaai.org      "Like" us  on Facebook and Instagram for our latest updates!      A healthy democracy works best when [employer] participate! Make sure you are registered to vote! If you have moved or changed any of your contact information, you will need to get this updated before voting! Scan the QR codes below to learn more!        Intradermal - 08/13/23 1012     Time Antigen Placed 1000    Allergen Manufacturer Floyd Hutchinson    Location Arm    Number of Test 14    Control Negative    [place] 2+    [place] 2+    Johnson Negative    7 Grass Negative    Ragweed Mix Negative    Weed Mix 2+    Tree Mix 1+    Mold 1 Negative    Mold 2 Negative    Mold 3 2+    Mold 4 2+    Cat Negative    Dog Negative          Food Adult Perc - 08/13/23 0900     Number of allergen test 72    1. Peanut Negative    2. Soybean Negative    3. Wheat  Negative    4. Sesame Negative    5. Milk, Cow Negative    6. Casein Negative    7. Egg White, Chicken Negative    8. Shellfish Mix Negative    9. Fish Mix Negative    10. Cashew Negative    11. Walnut Food Negative    12. Almond Negative    13. Hazelnut Negative    14. Pecan Food Negative    15. Pistachio Negative    16. [place] Nut Negative    17. Coconut Negative    18. Trout Negative    19. Tuna Negative    20. Salmon Negative    21. Flounder Negative    22. Codfish Negative    23. Shrimp Negative    24. Crab Negative    25. Lobster Negative    26. Oyster Negative    27. Scallops Negative    28. Oat  Negative    29. Rice Negative    30. Barley Negative    31. Rye  Negative    32. Hops  Negative    33. [place] Meat Negative    34. Chicken Meat Negative    35. Pork Negative    36. Beef Negative    37. Lamb Negative    38. Tomato Negative    39. White Potato Negative    40. Sweet Potato Negative    41. Pea, Green/English Negative    42. Navy Bean Negative    43. Green Beans Negative    44. Squash Negative    45. Green Pepper Negative    46. Mushrooms Negative    47. Onion Negative    48. Avocado Negative    49. Cabbage Negative    50. Carrots Negative    51. Celery Negative    52. Corn Negative    53. Cucumber Negative    54. Grape (White seedless) Negative    55. Orange  Negative    56. Lemon Negative    57. Banana Negative    58. Apple Negative    59. Peach Negative    60. Strawberry Negative    61. Blueberry Negative    62. Cherry Negative    63. Cantaloupe Negative    64. Watermelon Negative    65. Pineapple Negative    66. Chocolate/Cacao Bean Negative    67. Cinnamon Negative    68. Nutmeg Negative    69. Ginger Negative    70. Garlic Negative    71. Pepper, Black Negative    72. Mustard Negative

## 2023-08-14 ENCOUNTER — Ambulatory Visit: Payer: Self-pay | Admitting: Allergy & Immunology

## 2023-08-14 LAB — EBV AB TO VIRAL CAPSID AG PNL, IGG+IGM
EBV VCA IgG: 520 U/mL — ABNORMAL HIGH (ref 0.0–17.9)
EBV VCA IgM: <36 U/mL (ref 0.0–35.9)

## 2023-08-14 LAB — EPSTEIN BARR VRS(EBV DNA BY PCR): EBV DNA, Quant PCR, Plasma: NEGATIVE [IU]/mL

## 2023-08-14 NOTE — Telephone Encounter (Signed)
 Jill Melendez

## 2023-08-15 LAB — OTHER LAB TEST: PDF: 0

## 2023-08-15 LAB — SPECIMEN STATUS REPORT

## 2023-08-18 LAB — B CELL SUBSET ANALYSIS
Activated CD21low CD38- %: 1.9 % (ref 1.2–9.0)
Activated CD21low CD38-: 4 {cells}/uL (ref 3–26)
CD19+ B cells %: 10.5 % (ref 6.4–22.0)
CD19+ B cells: 189 {cells}/uL (ref 110–450)
CD20+ %: 99 % (ref 96.0–100.0)
CD20+: 188 {cells}/uL (ref 110–450)
Class-switched CD27+IgD-IgM- %: 17.8 % (ref 5.1–22.0)
Class-switched CD27+IgD-IgM-: 34 {cells}/uL (ref 11–61)
Non switched CD27+IgD+IgM+ %: 13.3 % (ref 2.4–15.0)
Non switched CD27+IgD+IgM+: 25 {cells}/uL (ref 5–46)
Plasmablasts CD38+IgM- %: 0.2 % — ABNORMAL LOW (ref 0.4–4.1)
Plasmablasts CD38+IgM-: 0 {cells}/uL — ABNORMAL LOW (ref 1–8)
Total Memory CD27+ %: 32.7 % (ref 10.0–33.0)
Total Memory CD27+: 62 {cells}/uL (ref 23–110)
Transitional CD38+IgM+ %: 2.5 % (ref 0.7–5.9)
Transitional CD38+IgM+: 5 {cells}/uL (ref 1–17)

## 2023-08-19 ENCOUNTER — Ambulatory Visit: Payer: Self-pay | Admitting: Allergy & Immunology

## 2023-09-03 ENCOUNTER — Ambulatory Visit: Admitting: Internal Medicine

## 2023-09-21 ENCOUNTER — Encounter: Admitting: Family Medicine

## 2023-09-23 ENCOUNTER — Encounter: Payer: Self-pay | Admitting: Neurology

## 2023-09-23 ENCOUNTER — Encounter: Admitting: Allergy

## 2023-09-23 ENCOUNTER — Ambulatory Visit (INDEPENDENT_AMBULATORY_CARE_PROVIDER_SITE_OTHER): Admitting: Neurology

## 2023-09-23 VITALS — BP 110/72 | HR 77 | Ht 60.24 in | Wt 118.0 lb

## 2023-09-23 DIAGNOSIS — G4485 Primary stabbing headache: Secondary | ICD-10-CM | POA: Diagnosis not present

## 2023-09-23 DIAGNOSIS — R292 Abnormal reflex: Secondary | ICD-10-CM | POA: Diagnosis not present

## 2023-09-23 DIAGNOSIS — M79641 Pain in right hand: Secondary | ICD-10-CM

## 2023-09-23 DIAGNOSIS — R29898 Other symptoms and signs involving the musculoskeletal system: Secondary | ICD-10-CM | POA: Diagnosis not present

## 2023-09-23 NOTE — Progress Notes (Addendum)
 [facility] Clinic Note - Initial Visit   Date: 09/23/2023   Jill Melendez MRN: [id number] DOB: 1970/04/16   Dear Jill Flatness, PA-C:  Thank you for your kind referral of Jill Melendez for consultation of bilateral leg weakness. Although her history is well known to you, please allow us  to reiterate it for the purpose of our medical record. The patient was accompanied to the clinic by self.   Jill Melendez is a 53 y.o. right-handed female presenting for evaluation of bilateral leg weakness.   IMPRESSION/PLAN: Intermittent bilateral leg weakness.  Her neurological exam is normal and does not disclose weakness or abnormalities to suggest motor neuron disease.  Further NCS/EMG performed at [facility] did not reveal any changes to suggest this.  Patient reassured that she does not have any findings of ALS, neuropathy, or myopathy.   Myriad of generalized paresthesias, pain, and weakness. With her exam being normal, it is difficult to localize her symptoms and makes primary neurological condition less likely.  To be complete, I will check imaging of the brain and cervical spine.   - MRI brain wwo contrast  - MRI cervical spine wwo contrast  - Request NCS/EMG from [facility]  Further recommendations pending results.  ------------------------------------------------------------- History of present illness: Over the past 6 months, she has been having intermittent leg weakness along with radiating achy sensation in there hips which limited his ability to walk at times.  She has a lot of diffuse joint pain, including right shoulder pain.  She also reports having numbness/tingling in the hands and had NCS/EMG of the arms at [facility].  I do not have these results to review.  She was told there it was normal and no signs of ALS.  She may have a pinched nerve in the spine.  She was given a trial of steroid injection to her wrist and reports worsening pain and paresthesia in the  right had with this.  She was also having numbness, pain, and swelling of the hands and tried occupational therapy.  Sometimes, she has stabbing pain in the head which lasts a few seconds.  She is concerned about ALS because of family history of this. She reports having 3 maternal great aunt with neurological conditions -  ALS, FTD, and ALS-FTD.  Out-side paper records, electronic medical record, and images have been reviewed where available and summarized as:  MRI brain wwo contrast 02/16/2014: IMPRESSION: 1. Negative IAC imaging. 2. Normal MRI appearance of the brain.   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   cyclobenzaprine  (FLEXERIL ) 10 MG tablet Take 10 mg by mouth 3 (  three) times daily as needed for muscle spasms.   LYSINE PO Take by mouth.   Methylcobalamin (B12-ACTIVE) 1 MG CHEW as directed Orally   methylPREDNISolone  (MEDROL  DOSEPAK) 4 MG TBPK tablet Take 6 tablets by mouth on day 1, 5 tabs on day 2, 4 tabs on day 3, 3 tabs on day 4, 2 tabs on day 5, 1 tab on day 6. Then stop. (Patient not taking: Reported on 09/23/2023)   No facility-administered encounter medications on file as of 09/23/2023.    Allergies:  Allergies  Allergen Reactions   Other  Anaphylaxis    [place] veg-joint pain   Advair Diskus [Fluticasone-Salmeterol] Other (See Comments)    Visual changes    Bee Venom Swelling   Buspirone Other (See Comments)    Severe headache   Codeine Itching and Nausea And Vomiting   Fluoxetine Other (See Comments)    Felt weird   Ibuprofen Other (See Comments)    Feels like she is sedated   Nsaids Other (See Comments)    Feels sedated   Sertraline Hcl Other (See Comments)    Couldn't sleep; OCD   Latex Rash    Blisters     Family History: Family History  Problem Relation Age of Onset   High blood pressure Mother    Stroke Mother    Colon polyps Mother    Heart disease Mother    Hypertension Mother    Vision loss Mother    Eczema Father    Cancer Father        bladder; prostate   COPD Father    Hearing loss Father    Kidney disease Father    Breast cancer Paternal Aunt    Cancer Paternal Aunt    Heart disease Maternal Grandmother    Varicose Veins Maternal Grandmother    Cancer Maternal Grandfather    Kidney disease Maternal Grandfather    COPD Maternal Grandfather    Heart disease Paternal Grandmother    Early death Paternal Grandmother    Colon cancer Paternal Grandfather 46   Esophageal cancer Neg Hx    Rectal cancer Neg Hx    Stomach cancer Neg Hx     Social History: Social History   Tobacco Use   Smoking status: Never    Passive exposure: Current (Dad)   Smokeless tobacco: Never  Vaping Use   Vaping status: Never Used  Substance Use Topics   Alcohol use: Not Currently    Alcohol/week: 1.0 standard drink of alcohol    Comment: occ   Drug use: Yes    Types: Marijuana    Comment: not often   Social History   Social History Narrative   Married, Museum/gallery conservator   Self-employed esthetician nail tech   Caffeine use: none. Stopped drinking any caffeine in 2014.             Are you right handed or left handed? Right Handed    Are you currently employed ? Yes   What is your current  occupation? [employer]    Do you live at home alone? No    Who lives with you? Father and Fiance    What type of home do you live in: 1 story or 2 story? Lives in a two story home.         Vital Signs:  BP 110/72   Pulse 77   Ht 5' 0.24 (1.53 m)   Wt 118 lb (53.5 kg)   LMP 09/24/2021 (Approximate)  SpO2 99%   BMI 22.86 kg/m    Neurological Exam: MENTAL STATUS including orientation to time, place, person, recent and remote memory, attention span and concentration, language, and fund of knowledge is normal.  Speech is not dysarthric.  CRANIAL NERVES: II:  No visual field defects.     III-IV-VI: Pupils equal round and reactive to light.  Normal conjugate, extra-ocular eye movements in all directions of gaze.  No nystagmus.  No ptosis.   V:  Normal facial sensation.    VII:  Normal facial symmetry and movements.   VIII:  Normal hearing and vestibular function.   IX-X:  Normal palatal movement.   XI:  Normal shoulder shrug and head rotation.   XII:  Normal tongue strength and range of motion, no deviation or fasciculation.  MOTOR:  No atrophy, fasciculations or abnormal movements.  No pronator drift.   Upper Extremity:  Right  Left  Deltoid  5/5   5/5   Biceps  5/5   5/5   Triceps  5/5   5/5   Infraspinatus 5/5  5/5  Medial pectoralis 5/5  5/5  Wrist extensors  5/5   5/5   Wrist flexors  5/5   5/5   Finger extensors  5/5   5/5   Finger flexors  5/5   5/5   Dorsal interossei  5/5   5/5   Abductor pollicis  5/5   5/5   Tone (Ashworth scale)  0  0   Lower Extremity:  Right  Left  Hip flexors  5/5   5/5   Hip extensors  5/5   5/5   Adductor 5/5  5/5  Abductor 5/5  5/5  Knee flexors  5/5   5/5   Knee extensors  5/5   5/5   Dorsiflexors  5/5   5/5   Plantarflexors  5/5   5/5   Toe extensors  5/5   5/5   Toe flexors  5/5   5/5   Tone (Ashworth scale)  0  0   MSRs:                                           Right        Left brachioradialis 2+  2+  biceps 2+  2+   triceps 2+  2+  patellar 3+  3+  ankle jerk 2+  2+  plantar response down  down   SENSORY:  Normal and symmetric perception of light touch, pinprick, vibration, and proprioception.  Romberg's sign absent.   COORDINATION/GAIT: Normal finger-to- nose-finger.  Intact rapid alternating movements bilaterally.  Able to rise from a chair without using arms.  Gait narrow based and stable. Tandem and stressed gait intact.     Thank you for allowing me to participate in patient's care.  If I can answer any additional questions, I would be pleased to do so.    Sincerely,    Imelda Dandridge K. Tobie, DO

## 2023-09-24 ENCOUNTER — Encounter: Payer: Self-pay | Admitting: Neurology

## 2023-09-25 ENCOUNTER — Encounter: Admitting: Allergy

## 2023-10-02 ENCOUNTER — Other Ambulatory Visit

## 2023-10-07 ENCOUNTER — Encounter: Payer: Self-pay | Admitting: Neurology

## 2023-10-13 NOTE — Progress Notes (Signed)
 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 Pending determination.

## 2023-10-16 ENCOUNTER — Other Ambulatory Visit: Payer: Self-pay

## 2023-10-16 ENCOUNTER — Telehealth: Payer: Self-pay

## 2023-10-16 DIAGNOSIS — R292 Abnormal reflex: Secondary | ICD-10-CM

## 2023-10-16 DIAGNOSIS — G4485 Primary stabbing headache: Secondary | ICD-10-CM

## 2023-10-16 DIAGNOSIS — M79641 Pain in right hand: Secondary | ICD-10-CM

## 2023-10-16 NOTE — Telephone Encounter (Signed)
 Received notification from patients insurance [employer] that prior authorization for MRI cervical and MRI brain have been denied. Suggested patient follow up with doctor for next step in care. See denial letter.

## 2023-10-16 NOTE — Telephone Encounter (Signed)
 Please let pt know insurance has denied MRI imaging.  Instead, let's order CT head and CT cervical spine instead.  Thanks.

## 2023-10-16 NOTE — Telephone Encounter (Signed)
 Mychart message sent to patient.

## 2023-10-20 ENCOUNTER — Other Ambulatory Visit: Payer: Self-pay

## 2023-10-20 NOTE — Progress Notes (Signed)
 CT Brain and CT Cervical prior authorization submitted through [employer]. Pending determination.Tracking:[id number]

## 2023-10-23 ENCOUNTER — Telehealth: Payer: Self-pay

## 2023-10-23 NOTE — Telephone Encounter (Signed)
 I recommend that she start neck PT to see if this will help any of her symptoms.  Instruct her to call back with an update after she has completed 6 weeks of PT and we can reorder.  Thanks.

## 2023-10-23 NOTE — Telephone Encounter (Signed)
 Received notification that patients CT has been denied. Office visit exam does not support medical necessity for imaging.

## 2023-10-27 ENCOUNTER — Other Ambulatory Visit: Payer: Self-pay

## 2023-10-27 DIAGNOSIS — M79641 Pain in right hand: Secondary | ICD-10-CM

## 2023-10-27 DIAGNOSIS — R29898 Other symptoms and signs involving the musculoskeletal system: Secondary | ICD-10-CM

## 2023-11-10 ENCOUNTER — Other Ambulatory Visit: Payer: Self-pay

## 2023-11-10 ENCOUNTER — Encounter: Payer: Self-pay | Admitting: Allergy & Immunology

## 2023-11-10 ENCOUNTER — Ambulatory Visit (INDEPENDENT_AMBULATORY_CARE_PROVIDER_SITE_OTHER): Admitting: Allergy & Immunology

## 2023-11-10 VITALS — BP 100/60 | HR 69 | Temp 98.1°F | Resp 16 | Ht 61.25 in | Wt 115.4 lb

## 2023-11-10 DIAGNOSIS — T7840XD Allergy, unspecified, subsequent encounter: Secondary | ICD-10-CM

## 2023-11-10 DIAGNOSIS — R52 Pain, unspecified: Secondary | ICD-10-CM | POA: Diagnosis not present

## 2023-11-10 DIAGNOSIS — R1084 Generalized abdominal pain: Secondary | ICD-10-CM

## 2023-11-10 DIAGNOSIS — B999 Unspecified infectious disease: Secondary | ICD-10-CM

## 2023-11-10 NOTE — Progress Notes (Signed)
 FOLLOW UP  Date of Service/Encounter:  11/10/23   Assessment:   Chronic urticaria - with negative workup for mast cell activation syndrome   Allergic reaction - no obvious trigger    Allergic contact dermatitis - scheduled for patch testing in July   Perennial and seasonal allergic rhinitis (grasses, weeds, trees, mold, cockroach, dust mites)   Recurrent infections - with inadequate protection against Streptococcus pneumonia (sending [facility] genetic testing)   Generalized inflammation with pan positive review of systems    Concern for food allergies - with negative testing to the entire food panel  Plan/Recommendations:   1. Chronic urticaria - with allergic reactions and generalized inflammation  - We are going to get some  - Consider going to see [facility] ([email or web address]).  2. Recurrent infections - You were not protective against Streptococcus pneumonia, but everything else was normal. - Consider getting a Pneumovax or a Prevnar 20 and get repeat titers 4-6 weeks after this. - I understand your concerns with the vaccines, however. - We are going to get genetic testing.  3. Allergic contact dermatitis - Consider doing patch testing to see what might be causing your skin symptoms. - This will look for sensitivities to plastics/metals/chemicals/etc. - This might help us  figure out what is causing your skin issues.  - We will get a butter fly patch plastic part for the patch testing. - This is scheduled for October 20th and we can add different other products as well.   4. Perennial and seasonal allergic rhinitis (grasses, weeds, trees, mold, cockroach, dust mites) - Avoidance measures and results provided.  - We can add on quercetin if you are interested (this is a natural antihistamine that might help).  5. Concern for food allergies - Continue with the juice cleanse as you are doing. - Testing in the past was negative  to the entire panel.   6. Return in about 6 months (around 05/09/2024).   Subjective:   Jill Melendez is a 53 y.o. female presenting today for follow up of  Chief Complaint  Patient presents with   Follow-up    She was ant bite on right foot- swelling x 2 weeks ago. She has a picture with her.    Jill Melendez has a history of the following: Patient Active Problem List   Diagnosis Date Noted   Non-alcoholic fatty liver disease 03/20/2023    History obtained from: chart review and patient.  Discussed the use of AI scribe software for clinical note transcription with the patient and/or guardian, who gave verbal consent to proceed.  Jill Melendez is a 52 y.o. female presenting for a follow up visit.  She was last seen in June 2025.  At that time, we had already done extensive workup to evaluate for mast cell activation syndrome.  This was all good news.  We recommended that she see an integrative medicine practice.  For her recurrent infections, she was not protective against Streptococcus pneumonia.  We recommended that she get a Pneumovax or Prevnar 20 and repeat titers.  She was not very excited about vaccines.  Her contact dermatitis, we talked about doing patch testing.  For her rhinitis, she had labs that were positive to cockroach and dust mites as well as pecan tree.  Intradermal testing was positive to grasses, weeds, trees, and mold.  For her concern for food allergies, she had testing to the entire 72 item food panel that was negative.  Since the last visit, she has mostly been  about the same.   She had an ant bite on her ankle. She had marked swelling. She reports that she had some pilling ointment to help with it. It started itching and burning in the middle of the night. She was using Benadryl  spray and she had ocular swelling. This was on August 31st.   She experiences recurrent episodes of significant swelling and itching following insect bites. Two weeks ago, she was bitten by  a black ant on her ankle, resulting in significant swelling of her foot. She applied a cooling ointment and used Benadryl  spray to manage the itching and burning sensations. She recalls a similar reaction in the past where her eyes swelled up after an insect bite, requiring a steroid and histamine shot at an urgent care facility. She has a history of severe reactions to insect stings, including a wasp sting that caused prolonged knee swelling.  She reports severe pain flares in her shoulder that radiate to her hand, causing swelling. She has a history of similar episodes and has previously undergone testing, but results have been inconclusive. A hand specialist considered repeating some tests, but they were not repeated. She was scheduled for a brain and neck MRI, but her insurance did not cover the procedure. A CT scan was also not approved, and her insurance required her to undergo physical therapy again, despite her having recently completed a course. She is currently on a juice cleanse, which she believes has reduced the frequency and duration of her pain flares. She has been juicing celery every morning and plans to continue this practice.   Allergic Rhinitis Symptom History: She has been dealing with mold issues in her home, which have exacerbated her allergies. She has not been using nasal sprays or antihistamines regularly, as she does not find them effective.  Skin Symptom History: She describes a possible allergic reaction to the plastic component of butterfly needles, causing intense itching at the site of contact. She plans to include this in her upcoming patch test. She is scheduled for patch testing already in October.  She also is concerned with a metal allergy .  Infection Symptom History: She mentions a previous test indicating a potential issue with her B cell maturation, which might explain her frequent infections.  She is fine with getting genetic testing done.  Otherwise, there have  been no changes to her past medical history, surgical history, family history, or social history.    Review of systems otherwise negative other than that mentioned in the HPI.    Objective:   Blood pressure 100/60, pulse 69, temperature 98.1 F (36.7 C), temperature source Temporal, resp. rate 16, height 5' 1.25 (1.556 m), weight 115 lb 6.4 oz (52.3 kg), last menstrual period 09/24/2021, SpO2 100%. Body mass index is 21.63 kg/m.    Physical Exam Vitals reviewed.  Constitutional:      Appearance: She is well-developed.     Comments: Talkative.  Seems to be a bit more relaxed today.   HENT:     Head: Normocephalic and atraumatic.     Right Ear: Tympanic membrane, ear canal and external ear normal. No drainage, swelling or tenderness. Tympanic membrane is not injected, scarred, erythematous, retracted or bulging.     Left Ear: Tympanic membrane, ear canal and external ear normal. No drainage, swelling or tenderness. Tympanic membrane is not injected, scarred, erythematous, retracted or bulging.     Nose: No nasal deformity, septal deviation, mucosal edema or rhinorrhea.     Right Turbinates: Enlarged,  swollen and pale.     Left Turbinates: Enlarged, swollen and pale.     Right Sinus: No maxillary sinus tenderness or frontal sinus tenderness.     Left Sinus: No maxillary sinus tenderness or frontal sinus tenderness.     Comments: No polyps.     Mouth/Throat:     Mouth: Mucous membranes are not pale and not dry.     Pharynx: Uvula midline.  Eyes:     General:        Right eye: No discharge.        Left eye: No discharge.     Conjunctiva/sclera: Conjunctivae normal.     Right eye: Right conjunctiva is not injected. No chemosis.    Left eye: Left conjunctiva is not injected. No chemosis.    Pupils: Pupils are equal, round, and reactive to light.  Cardiovascular:     Rate and Rhythm: Normal rate and regular rhythm.     Heart sounds: Normal heart sounds.  Pulmonary:     Effort:  Pulmonary effort is normal. No tachypnea, accessory muscle usage or respiratory distress.     Breath sounds: Normal breath sounds. No wheezing, rhonchi or rales.  Chest:     Chest wall: No tenderness.  Abdominal:     Tenderness: There is no abdominal tenderness. There is no guarding or rebound.  Lymphadenopathy:     Head:     Right side of head: No submandibular, tonsillar or occipital adenopathy.     Left side of head: No submandibular, tonsillar or occipital adenopathy.     Cervical: No cervical adenopathy.  Skin:    Coloration: Skin is not pale.     Findings: No abrasion, erythema, petechiae or rash. Rash is not papular, urticarial or vesicular.  Neurological:     Mental Status: She is alert.  Psychiatric:        Behavior: Behavior is cooperative.      Diagnostic studies: none      Marty Shaggy, MD  [facility] 

## 2023-11-10 NOTE — Patient Instructions (Addendum)
 1. Chronic urticaria - with allergic reactions and generalized inflammation  - We are going to get some  - Consider going to see [facility] ([email or web address].[email or web address]).  2. Recurrent infections - You were not protective against Streptococcus pneumonia, but everything else was normal. - Consider getting a Pneumovax or a Prevnar 20 and get repeat titers 4-6 weeks after this. - I understand your concerns with the vaccines, however. - We are going to get genetic testing.  3. Allergic contact dermatitis - Consider doing patch testing to see what might be causing your skin symptoms. - This will look for sensitivities to plastics/metals/chemicals/etc. - This might help us  figure out what is causing your skin issues.  - We will get a butter fly patch plastic part for the patch testing. - This is scheduled for October 20th and we can add different other products as well.   4. Perennial and seasonal allergic rhinitis (grasses, weeds, trees, mold, cockroach, dust mites) - Avoidance measures and results provided.  - We can add on quercetin if you are interested (this is a natural antihistamine that might help).  5. Concern for food allergies - Continue with the juice cleanse as you are doing. - Testing in the past was negative to the entire panel.   6. Return in about 6 months (around 05/09/2024).    Please inform us  of any Emergency Department visits, hospitalizations, or changes in symptoms. Call us  before going to the ED for breathing or allergy  symptoms since we might be able to fit you in for a sick visit. Feel free to contact us  anytime with any questions, problems, or concerns.  It was a pleasure to see you again today!  Websites that have reliable patient information: 1. American Academy of Asthma, Allergy , and Immunology: www.aaaai.org 2. Food Allergy  Research and Education (FARE): foodallergy.org 3. Mothers of Asthmatics:  http://www.asthmacommunitynetwork.org 4. [employer] of Allergy , Asthma, and Immunology: www.acaai.org      "Like" us  on Facebook and Instagram for our latest updates!      A healthy democracy works best when [employer] participate! Make sure you are registered to vote! If you have moved or changed any of your contact information, you will need to get this updated before voting! Scan the QR codes below to learn more!

## 2023-11-13 LAB — HYMENOPTERA VENOM ALLERGY II

## 2023-11-14 LAB — HYMENOPTERA VENOM ALLERGY II
Bumblebee: <0.1 kU/L
Hornet, White Face, IgE: <0.1 kU/L
Hornet, Yellow, IgE: <0.1 kU/L
I001-IgE Honeybee: <0.1 kU/L
I003-IgE Yellow Jacket: <0.1 kU/L
I004-IgE Paper Wasp: <0.1 kU/L
I208-IgE Api m 1: <0.1 kU/L
I209-IgE Ves v 5: <0.1 kU/L
I210-IgE Pol d 5: <0.1 kU/L
I211-IgE Ves v 1: <0.1 kU/L
I214-IgE Api m 2: <0.1 kU/L
I215-IgE Api m 3: <0.1 kU/L
I216-IgE Api m 5: <0.1 kU/L
I217-IgE Api m 10: <0.1 kU/L
Tryptase: 4.9 ug/L (ref 2.2–13.2)

## 2023-11-14 LAB — ALLERGEN COMPONENT COMMENTS

## 2023-11-14 LAB — ALLERGEN FIRE ANT: I070-IgE Fire Ant (Invicta): 5.92 kU/L — AB

## 2023-11-18 ENCOUNTER — Telehealth: Payer: Self-pay

## 2023-11-18 ENCOUNTER — Ambulatory Visit: Payer: Self-pay | Admitting: Allergy & Immunology

## 2023-11-18 LAB — OVA AND PARASITE EXAMINATION

## 2023-11-18 NOTE — Telephone Encounter (Signed)
 Called [employer] pre-authorization/appeal/ peer to peer at [phone].  Spoke to [place] and was informed that peer to peer will not overturn decision. Will need to do appeal process. Informed that [employer] does not do the appeals and will have to follow denial paperwork for appeal process.

## 2023-11-19 NOTE — Telephone Encounter (Signed)
 Reviewed and will complete.

## 2023-11-19 NOTE — Telephone Encounter (Signed)
 Waiting on teams reply from provider

## 2023-11-20 ENCOUNTER — Other Ambulatory Visit: Payer: Self-pay

## 2023-11-24 ENCOUNTER — Ambulatory Visit: Admitting: Internal Medicine

## 2023-11-24 ENCOUNTER — Encounter: Payer: Self-pay | Admitting: Internal Medicine

## 2023-11-24 ENCOUNTER — Other Ambulatory Visit

## 2023-11-24 VITALS — BP 120/78 | HR 66 | Ht 61.0 in | Wt 116.4 lb

## 2023-11-24 DIAGNOSIS — R1011 Right upper quadrant pain: Secondary | ICD-10-CM

## 2023-11-24 DIAGNOSIS — K838 Other specified diseases of biliary tract: Secondary | ICD-10-CM | POA: Diagnosis not present

## 2023-11-24 NOTE — Patient Instructions (Signed)
 Your provider has requested that you go to the basement level for lab work before leaving today. Press B on the elevator. The lab is located at the first door on the left as you exit the elevator.  Due to recent changes in healthcare laws, you may see the results of your imaging and laboratory studies on MyChart before your provider has had a chance to review them.  We understand that in some cases there may be results that are confusing or concerning to you. Not all laboratory results come back in the same time frame and the provider may be waiting for multiple results in order to interpret others.  Please give us  48 hours in order for your provider to thoroughly review all the results before contacting the office for clarification of your results.   _______________________________________________________  If your blood pressure at your visit was 140/90 or greater, please contact your primary care physician to follow up on this.  _______________________________________________________  If you are age 52 or older, your body mass index should be between 23-30. Your Body mass index is 21.99 kg/m. If this is out of the aforementioned range listed, please consider follow up with your Primary Care Provider.  If you are age 52 or younger, your body mass index should be between 19-25. Your Body mass index is 21.99 kg/m. If this is out of the aformentioned range listed, please consider follow up with your Primary Care Provider.   ________________________________________________________  The [facility] providers would like to encourage you to use MYCHART to communicate with providers for non-urgent requests or questions.  Due to long hold times on the telephone, sending your provider a message by [facility] may be a faster and more efficient way to get a response.  Please allow 48 business hours for a response.  Please remember that this is for non-urgent requests.   _______________________________________________________  [facility] is using a team-based approach to care.  Your team is made up of your doctor and two to three APPS. Our APPS (Nurse Practitioners and Physician Assistants) work with your physician to ensure care continuity for you. They are fully qualified to address your health concerns and develop a treatment plan. They communicate directly with your gastroenterologist to care for you. Seeing the Advanced Practice Practitioners on your physician's team can help you by facilitating care more promptly, often allowing for earlier appointments, access to diagnostic testing, procedures, and other specialty referrals.   I appreciate the opportunity to care for you. Lupita Commander, MD, [facility]

## 2023-11-24 NOTE — Progress Notes (Signed)
 Jill Melendez 52 y.o. 14-Apr-1970 [id number]  Assessment & Plan:   Encounter Diagnoses  Name Primary?   RUQ pain Yes   Dilated intrahepatic bile duct - mild    Gilbert's syndrome     Will recheck LFTs.  If she has elevation in transaminases or alkaline phosphatase or significant elevation in bilirubin not related to Gilbert's would consider repeat imaging.  Most likely would not repeat unless she is having elevated LFTs or obvious biliary colic (which she has not had).  We reviewed her tiny liver cyst and also that MRI did not show steatosis and that is more sensitive than ultrasound.  She was reassured as best I could today.  I do not think further GI workup is needed.  Long history of functional GI complaints.  Please see my 06/22/2023 note for further details also.   Subjective:   Chief Complaint: Follow-up of dilated bile duct, right upper quadrant pain  HPI 53 year old woman seen in April with right upper quadrant pressure-like pain like a football.  An ultrasound had showed a dilated intrahepatic bile duct with suspected fatty liver also, and an MRI MRCP was performed.  The patient had mild diffuse biliary ductal dilation on MRCP with maximum CBD 8 mm, and a tiny subcentimeter cyst.  There were no other abnormalities, no choledocholithiasis.  LFTs have been normal except for isolated indirect hyperbilirubinemia, she has Gilbert's.  She has been seen by allergy , she has urticaria problems but does not have mast cell activation syndrome.  She has allergic contact dermatitis and perennial and seasonal allergic rhinitis.  She apparently has recurrent infections with inadequate protection against Streptococcus.  She also has intermittent lower extremity weakness and has seen neurology workup pending.  Again her gastrointestinal symptoms are improved.  I did try her on amitriptyline but she had side effects from that so we discontinued that.  She reports she was concerned about  potentially having parasites because of dogs and other pets licking all over her.  She has soft stools on a regular basis but has not had diarrhea in open parasite exam was negative.  Wt Readings from Last 3 Encounters:  11/24/23 116 lb 6 oz (52.8 kg)  11/10/23 115 lb 6.4 oz (52.3 kg)  09/23/23 118 lb (53.5 kg)     Allergies  Allergen Reactions   Other Anaphylaxis    Night Shade veg-joint pain   Advair Diskus [Fluticasone-Salmeterol] Other (See Comments)    Visual changes    Bee Venom Swelling   Buspirone Other (See Comments)    Severe headache   Codeine Itching and Nausea And Vomiting   Fluoxetine Other (See Comments)    Felt weird   Ibuprofen Other (See Comments)    Feels like she is sedated   Nortriptyline  Itching and Swelling   Nsaids Other (See Comments)    Feels sedated   Sertraline Hcl Other (See Comments)    Couldn't sleep; OCD   Latex Rash    Blisters    Current Meds  Medication Sig   Cholecalciferol (VITAMIN D3) 1000 units CAPS 1 capsule.   cyclobenzaprine  (FLEXERIL ) 10 MG tablet Take 10 mg by mouth 3 (three) times daily as needed for muscle spasms.   LYSINE PO Take by mouth.   Methylcobalamin (B12-ACTIVE) 1 MG CHEW as directed Orally   Past Medical History:  Diagnosis Date   Allergy     seasonal   Anxiety    Endometriosis    Fatty liver disease, nonalcoholic    Idiopathic  stabbing headache 06/19/2014   Influenza due to influenza virus, type B 04/12/2014   Migraine    Sepsis (HCC) 04/11/2014   Past Surgical History:  Procedure Laterality Date   BREAST EXCISIONAL BIOPSY Left 1994   BREAST SURGERY     COLONOSCOPY     SIGMOIDOSCOPY     some upper Gi test     pt states was not EGD   TYMPANOSTOMY TUBE PLACEMENT     Social History   Social History Narrative   Married, Museum/gallery conservator   Self-employed esthetician nail tech   Caffeine use: none. Stopped drinking any caffeine in 2014.             Are you right handed or left handed? Right Handed     Are you currently employed ? Yes   What is your current occupation? [employer]    Do you live at home alone? No    Who lives with you? Father and Fiance    What type of home do you live in: 1 story or 2 story? Lives in a two story home.        family history includes Breast cancer in her paternal aunt; COPD in her father and maternal grandfather; Cancer in her father, maternal grandfather, and paternal aunt; Colon cancer (age of onset: 19) in her paternal grandfather; Colon polyps in her mother; Early death in her paternal grandmother; Eczema in her father; Hearing loss in her father; Heart disease in her maternal grandmother, mother, and paternal grandmother; High blood pressure in her mother; Hypertension in her mother; Kidney disease in her father and maternal grandfather; Stroke in her mother; Varicose Veins in her maternal grandmother; Vision loss in her mother.   Review of Systems As per HPI  Objective:   Physical Exam BP 120/78   Pulse 66   Ht 5' 1 (1.549 m)   Wt 116 lb 6 oz (52.8 kg)   LMP 09/24/2021 (Approximate)   BMI 21.99 kg/m  Abdomen soft nontender no organomegaly or mass

## 2023-11-25 ENCOUNTER — Other Ambulatory Visit (INDEPENDENT_AMBULATORY_CARE_PROVIDER_SITE_OTHER)

## 2023-11-25 DIAGNOSIS — R1011 Right upper quadrant pain: Secondary | ICD-10-CM

## 2023-11-25 DIAGNOSIS — K838 Other specified diseases of biliary tract: Secondary | ICD-10-CM | POA: Diagnosis not present

## 2023-11-26 ENCOUNTER — Ambulatory Visit: Payer: Self-pay | Admitting: Internal Medicine

## 2023-11-26 LAB — HEPATIC FUNCTION PANEL
ALT: 18 U/L (ref 0–35)
AST: 24 U/L (ref 0–37)
Albumin: 4.5 g/dL (ref 3.5–5.2)
Alkaline Phosphatase: 59 U/L (ref 39–117)
Bilirubin, Direct: 0.2 mg/dL (ref 0.0–0.3)
Total Bilirubin: 0.9 mg/dL (ref 0.2–1.2)
Total Protein: 7.1 g/dL (ref 6.0–8.3)

## 2023-11-30 NOTE — Progress Notes (Signed)
 Added to venom wait list for [facility]

## 2023-12-13 NOTE — Progress Notes (Unsigned)
 Follow-up Note  RE: Almarie Kurdziel MRN: [id number] DOB: Jul 10, 1970 Date of Office Visit: 12/14/2023  Primary care provider: Cyndi Shaver, PA-C (Inactive) Referring provider: Cyndi Shaver, DEVONNA Gustav returns to the office today for the patch test placement, given suspected history of contact dermatitis. Care of patches discussed in detail, specifically the need for patches to remain dry. Patient brings some plastic pieces with her that she would like to have placed on her back.    Diagnostics: NAC 80 patches placed NAC-80 (1-80)   1. Ammonium persulfate  2. [place] Balsam  3. Omitted  4. 4-tert-Butylphenolformaldehyde resin (PTBP)  5. Bacitracin  6. Budesonide  7. Quaternium-15  8. Cinnamal  9. Cobalt(II) chloride hexahydrate  10. Colophonium  11. Methyldibromo glutaronitrile  12. Decyl Glucoside  13. Ethylenediamine dihydrochloride  14. 2-Hydroxyethyl methacrylate  15. Hydroperoxides of Linalool  16. Iodopropynyl butylcarbamate  17. 2-Mercaptobenzothiazole (MBT)  18. Thiuram mix  19. METHYLISOTHIAZOLINONE  20. Propylene glycol  21. 1,3-Diphenylguanidine  22. Hydroperoxides of Limonene  23. Black rubber mix  24. Carba mix  25. Fragrance mix I  26. Fragrance mix II  27. Textile dye mix II  28. Neomycin sulfate  29. Nickel(II) sulfate hexahydrate  30. p-Phenylenediamine (PPD)  31. Potassium dichromate  32. Propolis  33. Sodium Metabisulfite  34. Tixocortol-21-pivalate  35. Lanolin alcohol  36. Methylisothiazolinone + Methylchloroisothiazolinone  37. Cocamidopropyl betaine  38. 3-(Dimethylamino)-1-propylamine  39. Formaldehyde  40. Oleamidopropyl dimethylamine  41. 2-Bromo-2-Nitropropane-l,3-diol  42. Diazolidinyl urea  43. DMDM Hydantoin  44. Epoxy resin, Bisphenol A  45. Benzophenone-4  46. Imidazolidinyl urea  47. Lauryl polyglucose  48 Methyl methacrylate  49. Paraben mix  50. Mercapto mix  51. Caine mix III  52. Mixed dialkyl thiourea  53.  Compositae mix II  54. Toluenesulfonamide formaldehyde resin  55. Tea Tree Oil oxidized  56. Ylang-Ylang oil  57. Amidoamine  58. Amerchol L 101  59. Benzocaine  60. Benzyl alchohol  61. Benzyl salicylate  62. Chloroxylenol (PCMX)  63. Cocamide DEA  64. Clobetasol-17-propionate  65. Toluene-2,5-Diamine sulfate  66. Ethyl acrylate  67. N-Isopropyl-N-phenyl--4-phenylenediamine (IPPD)  68. Lidocaine  69. Omitted  70. Sesquiterpene lactone mix  71. 2-n-Octyl-4-isothiazolin-3-one  72. Propyl gallate  73. Polymyxin B sulfate  74. Pramoxine hydrochloride  75. Sodium benzoate  76. Sorbitan oleate  77. Sorbitan sesquioleate  78. Tocopherol  79. BENZALKONIUM CHLORIDE  80. Chlorhexidine digluconate   Misc testing 1- plastic suction tubing with metal strip 2- plastic portion of butterfly needle 3- plastic transfer lab tube 4- plastic suction tubing with no metal 5- plastic top of suction tube  Allergic contact dermatitis - Instructions provided on care of the patches for the next 48 hours. Shaylen Nephew was instructed to avoid showering for the next 48 hours. Mazy Culton will follow up in 48 hours and 96 hours for patch readings.   Call the clinic if this treatment plan is not working well for you  Follow up in 2 days or sooner if needed.    [address] [place] [address] Dept: [phone]  FOLLOW UP NOTE  Patient ID: Laylah Riga, female    DOB: 19-Nov-1970  Age: 53 y.o. MRN: [id number] Date of Office Visit: 12/14/2023  Assessment  Chief Complaint: No chief complaint on file.  HPI Yamilee Harmes   Discussed the use of AI scribe software for clinical note transcription with the patient, who gave verbal consent to proceed.  History of Present Illness      Drug Allergies:  Allergies  Allergen Reactions   Other Anaphylaxis    [place] veg-joint pain   Advair Diskus [Fluticasone-Salmeterol] Other (See Comments)    Visual changes    Bee Venom Swelling    Buspirone Other (See Comments)    Severe headache   Codeine Itching and Nausea And Vomiting   Fluoxetine Other (See Comments)    Felt weird   Ibuprofen Other (See Comments)    Feels like she is sedated   Nortriptyline  Itching and Swelling   Nsaids Other (See Comments)    Feels sedated   Sertraline Hcl Other (See Comments)    Couldn't sleep; OCD   Latex Rash    Blisters     Physical Exam: LMP 09/24/2021 (Approximate)    Physical Exam  Diagnostics:    Assessment and Plan: 1. Allergic contact dermatitis, unspecified trigger     No orders of the defined types were placed in this encounter.   Patient Instructions  Diagnostics: NAC 80 patches placed NAC-80 (1-80)   1. Ammonium persulfate  2. [place] Balsam  3. Omitted  4. 4-tert-Butylphenolformaldehyde resin (PTBP)  5. Bacitracin  6. Budesonide  7. Quaternium-15  8. Cinnamal  9. Cobalt(II) chloride hexahydrate  10. Colophonium  11. Methyldibromo glutaronitrile  12. Decyl Glucoside  13. Ethylenediamine dihydrochloride  14. 2-Hydroxyethyl methacrylate  15. Hydroperoxides of Linalool  16. Iodopropynyl butylcarbamate  17. 2-Mercaptobenzothiazole (MBT)  18. Thiuram mix  19. METHYLISOTHIAZOLINONE  20. Propylene glycol  21. 1,3-Diphenylguanidine  22. Hydroperoxides of Limonene  23. Black rubber mix  24. Carba mix  25. Fragrance mix I  26. Fragrance mix II  27. Textile dye mix II  28. Neomycin sulfate  29. Nickel(II) sulfate hexahydrate  30. p-Phenylenediamine (PPD)  31. Potassium dichromate  32. Propolis  33. Sodium Metabisulfite  34. Tixocortol-21-pivalate  35. Lanolin alcohol  36. Methylisothiazolinone + Methylchloroisothiazolinone  37. Cocamidopropyl betaine  38. 3-(Dimethylamino)-1-propylamine  39. Formaldehyde  40. Oleamidopropyl dimethylamine  41. 2-Bromo-2-Nitropropane-l,3-diol  42. Diazolidinyl urea  43. DMDM Hydantoin  44. Epoxy resin, Bisphenol A  45. Benzophenone-4  46. Imidazolidinyl urea   47. Lauryl polyglucose  48 Methyl methacrylate  49. Paraben mix  50. Mercapto mix  51. Caine mix III  52. Mixed dialkyl thiourea  53. Compositae mix II  54. Toluenesulfonamide formaldehyde resin  55. Tea Tree Oil oxidized  56. Ylang-Ylang oil  57. Amidoamine  58. Amerchol L 101  59. Benzocaine  60. Benzyl alchohol  61. Benzyl salicylate  62. Chloroxylenol (PCMX)  63. Cocamide DEA  64. Clobetasol-17-propionate  65. Toluene-2,5-Diamine sulfate  66. Ethyl acrylate  67. N-Isopropyl-N-phenyl--4-phenylenediamine (IPPD)  68. Lidocaine  69. Omitted  70. Sesquiterpene lactone mix  71. 2-n-Octyl-4-isothiazolin-3-one  72. Propyl gallate  73. Polymyxin B sulfate  74. Pramoxine hydrochloride  75. Sodium benzoate  76. Sorbitan oleate  77. Sorbitan sesquioleate  78. Tocopherol  79. BENZALKONIUM CHLORIDE  80. Chlorhexidine digluconate   Misc testing 1- plastic suction tubing with metal 2- plastic portion of butterfly needle 3- plastic transfer lab tube 4- plastic suction tubing with no metal 5- plastic top of suction tube   Allergic contact dermatitis - Instructions provided on care of the patches for the next 48 hours. Elliana Bal was instructed to avoid showering for the next 48 hours. Kinberly Perris will follow up in 48 hours and 96 hours for patch readings.   Call the clinic if this treatment plan is not working well for you  Follow up in 2 days or sooner if needed.  Return  in about 2 days (around 12/16/2023), or if symptoms worsen or fail to improve.    Thank you for the opportunity to care for this patient.  Please do not hesitate to contact me with questions.  Arlean Mutter, FNP [facility]  

## 2023-12-14 ENCOUNTER — Ambulatory Visit: Admitting: Family Medicine

## 2023-12-14 ENCOUNTER — Encounter: Payer: Self-pay | Admitting: Family Medicine

## 2023-12-14 DIAGNOSIS — L239 Allergic contact dermatitis, unspecified cause: Secondary | ICD-10-CM

## 2023-12-14 NOTE — Patient Instructions (Signed)
 Diagnostics: NAC 80 patches placed NAC-80 (1-80)   1. Ammonium persulfate  2. [place] Balsam  3. Omitted  4. 4-tert-Butylphenolformaldehyde resin (PTBP)  5. Bacitracin  6. Budesonide  7. Quaternium-15  8. Cinnamal  9. Cobalt(II) chloride hexahydrate  10. Colophonium  11. Methyldibromo glutaronitrile  12. Decyl Glucoside  13. Ethylenediamine dihydrochloride  14. 2-Hydroxyethyl methacrylate  15. Hydroperoxides of Linalool  16. Iodopropynyl butylcarbamate  17. 2-Mercaptobenzothiazole (MBT)  18. Thiuram mix  19. METHYLISOTHIAZOLINONE  20. Propylene glycol  21. 1,3-Diphenylguanidine  22. Hydroperoxides of Limonene  23. Black rubber mix  24. Carba mix  25. Fragrance mix I  26. Fragrance mix II  27. Textile dye mix II  28. Neomycin sulfate  29. Nickel(II) sulfate hexahydrate  30. p-Phenylenediamine (PPD)  31. Potassium dichromate  32. Propolis  33. Sodium Metabisulfite  34. Tixocortol-21-pivalate  35. Lanolin alcohol  36. Methylisothiazolinone + Methylchloroisothiazolinone  37. Cocamidopropyl betaine  38. 3-(Dimethylamino)-1-propylamine  39. Formaldehyde  40. Oleamidopropyl dimethylamine  41. 2-Bromo-2-Nitropropane-l,3-diol  42. Diazolidinyl urea  43. DMDM Hydantoin  44. Epoxy resin, Bisphenol A  45. Benzophenone-4  46. Imidazolidinyl urea  47. Lauryl polyglucose  48 Methyl methacrylate  49. Paraben mix  50. Mercapto mix  51. Caine mix III  52. Mixed dialkyl thiourea  53. Compositae mix II  54. Toluenesulfonamide formaldehyde resin  55. Tea Tree Oil oxidized  56. Ylang-Ylang oil  57. Amidoamine  58. Amerchol L 101  59. Benzocaine  60. Benzyl alchohol  61. Benzyl salicylate  62. Chloroxylenol (PCMX)  63. Cocamide DEA  64. Clobetasol-17-propionate  65. Toluene-2,5-Diamine sulfate  66. Ethyl acrylate  67. N-Isopropyl-N-phenyl--4-phenylenediamine (IPPD)  68. Lidocaine  69. Omitted  70. Sesquiterpene lactone mix  71. 2-n-Octyl-4-isothiazolin-3-one  72. Propyl  gallate  73. Polymyxin B sulfate  74. Pramoxine hydrochloride  75. Sodium benzoate  76. Sorbitan oleate  77. Sorbitan sesquioleate  78. Tocopherol  79. BENZALKONIUM CHLORIDE  80. Chlorhexidine digluconate   Misc testing 1- plastic suction tubing with metal 2- plastic portion of butterfly needle 3- plastic transfer lab tube 4- plastic suction tubing with no metal 5- plastic top of suction tube  Allergic contact dermatitis - Instructions provided on care of the patches for the next 48 hours. Arna Luis was instructed to avoid showering for the next 48 hours. Jalaila Caradonna will follow up in 48 hours and 96 hours for patch readings.   Call the clinic if this treatment plan is not working well for you  Follow up in 2 days or sooner if needed.

## 2023-12-15 NOTE — Progress Notes (Unsigned)
   Follow Up Note  RE: Jill Melendez MRN: [id number] DOB: May 16, 1970 Date of Office Visit: 12/16/2023  Referring provider: Cyndi Shaver, PA-C Primary care provider: Cyndi Shaver, PA-C (Inactive)  History of Present Illness: I had the pleasure of seeing Jill Melendez for a follow up visit at the [facility] on 12/16/2023. She is a 53 y.o. female, who is being followed for dermatitis. Today she is here for initial patch test interpretation, given suspected history of contact dermatitis.   Diagnostics:  NAC-80 Panel 48 hour reading:     Assessment and Plan: Jill Melendez is a 53 y.o. female with: There are no diagnoses linked to this encounter. Patches removed and 48 hour reading was ***. The patient has been provided detailed information regarding the substances she is sensitive to, as well as products containing the substances.  Meticulous avoidance of these substances is recommended.   No follow-ups on file.  It was my pleasure to see Jill Melendez today and participate in her care. Please feel free to contact me with any questions or concerns.  Sincerely,  Orlan Cramp, DO Allergy  & Immunology  [facility]  [place] office: [phone] [facility] office: [phone]

## 2023-12-16 ENCOUNTER — Ambulatory Visit (INDEPENDENT_AMBULATORY_CARE_PROVIDER_SITE_OTHER): Admitting: Allergy

## 2023-12-16 ENCOUNTER — Encounter: Payer: Self-pay | Admitting: Allergy

## 2023-12-16 DIAGNOSIS — L2389 Allergic contact dermatitis due to other agents: Secondary | ICD-10-CM | POA: Diagnosis not present

## 2023-12-18 ENCOUNTER — Telehealth: Payer: Self-pay | Admitting: Allergy & Immunology

## 2023-12-18 ENCOUNTER — Ambulatory Visit (INDEPENDENT_AMBULATORY_CARE_PROVIDER_SITE_OTHER): Admitting: Family Medicine

## 2023-12-18 ENCOUNTER — Encounter: Payer: Self-pay | Admitting: Family Medicine

## 2023-12-18 DIAGNOSIS — L2389 Allergic contact dermatitis due to other agents: Secondary | ICD-10-CM | POA: Diagnosis not present

## 2023-12-18 NOTE — Telephone Encounter (Signed)
 MyChart sent with genetic results.

## 2023-12-18 NOTE — Progress Notes (Signed)
 Follow-up Note  RE: Jill Melendez MRN: [id number] DOB: July 26, 1970 Date of Office Visit: 12/18/2023  Primary care provider: Cyndi Shaver, PA-C (Inactive) Referring provider: Cyndi Shaver, DEVONNA Gustav returns to the office today for the final patch test interpretation, given suspected history of contact dermatitis.  All questions answered at today's visit.  She does report that she is going to dentist appointment tomorrow and will note if the plastic saliva collector irritates her lip.  She is going to take before and after pictures of her lip.  We discussed in detail that the items that she dropped with current have not been verified for allergy  testing and results may not be fully accurate.  All questions answered at today's visit.   Diagnostics:    NAC 80 48-hour reading:  NAC Panel Tested NAC-80 (1-80)   1. Ammonium persulfate 1+  2. [place] Balsam Negative   3. BENZISOTHIAZOLINONE Comment   omit  4. 4-tert-Butylphenolformaldehyde resin (PTBP) Negative   5. Bacitracin Negative   6. Budesonide Negative   7. Quaternium-15 Negative   8. Cinnamal Negative   9. Cobalt(II) chloride hexahydrate Negative  10. Colophonium Negative   11. Methyldibromo glutaronitrile Negative   12. Decyl Glucoside Negative   13. Ethylenediamine dihydrochloride Negative   14. 2-Hydroxyethyl methacrylate Negative   15. Hydroperoxides of Linalool Negative   16. Iodopropynyl butylcarbamate Negative   17. 2-Mercaptobenzothiazole (MBT) Negative   18. Thiuram mix Negative   19. METHYLISOTHIAZOLINONE Negative   20. Propylene glycol Negative   21. 1,3-Diphenylguanidine Negative   22. Hydroperoxides of Limonene Negative   23. Black rubber mix Negative   24. Carba mix Negative   25. Fragrance mix I Negative   26. Fragrance mix II Negative   27. Textile dye mix II Negative   28. Neomycin sulfate Negative   29. Nickel(II) sulfate hexahydrate Negative  30. p-Phenylenediamine (PPD) Negative   31.  Potassium dichromate Negative   32. Propolis Negative   33. Sodium Metabisulfite Negative   34. Tixocortol-21-pivalate Negative   35. Lanolin alcohol Negative   36. Methylisothiazolinone + Methylchloroisothiazolinone Negative   37. Cocamidopropyl betaine Negative   38. 3-(Dimethylamino)-1-propylamine Negative   39. Formaldehyde Negative  40. Oleamidopropyl dimethylamine Negative   41. 2-Bromo-2-Nitropropane-l,3-diol Negative   42. Diazolidinyl urea Negative  43. DMDM Hydantoin Negative   44. Epoxy resin, Bisphenol A Negative   45. Benzophenone-4 Negative   46. Imidazolidinyl urea Negative   47. Lauryl polyglucose Negative  48 Methyl methacrylate Negative   49. Paraben mix Negative   50. Mercapto mix Negative   51. Caine mix III Negative   52. Mixed dialkyl thiourea Negative   53. Compositae mix II Negative   54. Toluenesulfonamide formaldehyde resin Negative   55. Tea Tree Oil oxidized Negative   56. Ylang-Ylang oil Negative   57. Amidoamine Negative   58. Amerchol L 101 Negative   59. Benzocaine Negative   60. Benzyl alchohol Negative   61. Benzyl salicylate Negative   62. Chloroxylenol (PCMX) Negative   63. Cocamide DEA Negative   64. Clobetasol-17-propionate Negative   65. Toluene-2,5-Diamine sulfate Negative   66. Ethyl acrylate Negative   67. N-Isopropyl-N-phenyl--4-phenylenediamine (IPPD) Negative   68. Lidocaine Negative   69. Hydroxyisohexyl 3-Cyclohexene Carboxaldehyde Comment   omit  70. Sesquiterpene lactone mix Negative   71. 2-n-Octyl-4-isothiazolin-3-one Negative   72. Propyl gallate Negative   73. Polymyxin B sulfate Negative   74. Pramoxine hydrochloride Negative   75. Sodium benzoate Negative   76. Sorbitan  oleate Negative   77. Sorbitan sesquioleate Negative   78. Tocopherol Negative   79. BENZALKONIUM CHLORIDE Negative   80. Chlorhexidine digluconate Negative    Misc testing- all negative 12/18/2023 1- plastic suction tubing with metal strip 2-  plastic portion of butterfly needle 3- plastic transfer lab tube 4- plastic suction tubing with no metal 5- plastic top of suction tube   Testing positive at 48 hours- Ammonium persulfate   Plan:  Allergic contact dermatitis - The patient has been provided detailed information regarding the substances she is sensitive to, as well as products containing the substances.   - Meticulous avoidance of these substances is recommended.  - If avoidance is not possible, the use of barrier creams or lotions is recommended. - If symptoms persist or progress despite meticulous avoidance of the substances as listed above, a dermatology referral may be warranted. - The sensitivity of patch testing can range from 60-80% and therefore false negative results are possible. Therefore, this does not definitively rule out contact dermatitis.  - Email sent to address provided with a safe list  Call the clinic if this treatment plan is not working well for you  Follow up in 2 months or sooner if needed.  Thank you for the opportunity to care for this patient.  Please do not hesitate to contact me with questions.  Arlean Mutter, FNP [facility]  [facility]

## 2023-12-18 NOTE — Patient Instructions (Signed)
 Diagnostics:    NAC 80 48-hour reading:  NAC Panel Tested NAC-80 (1-80)   1. Ammonium persulfate 1+  2. [place] Balsam Negative   3. BENZISOTHIAZOLINONE Comment   omit  4. 4-tert-Butylphenolformaldehyde resin (PTBP) Negative   5. Bacitracin Negative   6. Budesonide Negative   7. Quaternium-15 Negative   8. Cinnamal Negative   9. Cobalt(II) chloride hexahydrate Negative  10. Colophonium Negative   11. Methyldibromo glutaronitrile Negative   12. Decyl Glucoside Negative   13. Ethylenediamine dihydrochloride Negative   14. 2-Hydroxyethyl methacrylate Negative   15. Hydroperoxides of Linalool Negative   16. Iodopropynyl butylcarbamate Negative   17. 2-Mercaptobenzothiazole (MBT) Negative   18. Thiuram mix Negative   19. METHYLISOTHIAZOLINONE Negative   20. Propylene glycol Negative   21. 1,3-Diphenylguanidine Negative   22. Hydroperoxides of Limonene Negative   23. Black rubber mix Negative   24. Carba mix Negative   25. Fragrance mix I Negative   26. Fragrance mix II Negative   27. Textile dye mix II Negative   28. Neomycin sulfate Negative   29. Nickel(II) sulfate hexahydrate Negative  30. p-Phenylenediamine (PPD) Negative   31. Potassium dichromate Negative   32. Propolis Negative   33. Sodium Metabisulfite Negative   34. Tixocortol-21-pivalate Negative   35. Lanolin alcohol Negative   36. Methylisothiazolinone + Methylchloroisothiazolinone Negative   37. Cocamidopropyl betaine Negative   38. 3-(Dimethylamino)-1-propylamine Negative   39. Formaldehyde Negative  40. Oleamidopropyl dimethylamine Negative   41. 2-Bromo-2-Nitropropane-l,3-diol Negative   42. Diazolidinyl urea Negative  43. DMDM Hydantoin Negative   44. Epoxy resin, Bisphenol A Negative   45. Benzophenone-4 Negative   46. Imidazolidinyl urea Negative   47. Lauryl polyglucose Negative  48 Methyl methacrylate Negative   49. Paraben mix Negative   50. Mercapto mix Negative   51. Caine mix III Negative   52.  Mixed dialkyl thiourea Negative   53. Compositae mix II Negative   54. Toluenesulfonamide formaldehyde resin Negative   55. Tea Tree Oil oxidized Negative   56. Ylang-Ylang oil Negative   57. Amidoamine Negative   58. Amerchol L 101 Negative   59. Benzocaine Negative   60. Benzyl alchohol Negative   61. Benzyl salicylate Negative   62. Chloroxylenol (PCMX) Negative   63. Cocamide DEA Negative   64. Clobetasol-17-propionate Negative   65. Toluene-2,5-Diamine sulfate Negative   66. Ethyl acrylate Negative   67. N-Isopropyl-N-phenyl--4-phenylenediamine (IPPD) Negative   68. Lidocaine Negative   69. Hydroxyisohexyl 3-Cyclohexene Carboxaldehyde Comment   omit  70. Sesquiterpene lactone mix Negative   71. 2-n-Octyl-4-isothiazolin-3-one Negative   72. Propyl gallate Negative   73. Polymyxin B sulfate Negative   74. Pramoxine hydrochloride Negative   75. Sodium benzoate Negative   76. Sorbitan oleate Negative   77. Sorbitan sesquioleate Negative   78. Tocopherol Negative   79. BENZALKONIUM CHLORIDE Negative   80. Chlorhexidine digluconate Negative    Misc testing- all negative 12/18/2023 1- plastic suction tubing with metal strip 2- plastic portion of butterfly needle 3- plastic transfer lab tube 4- plastic suction tubing with no metal 5- plastic top of suction tube   Testing positive at 48 hours- Ammonium persulfate   Plan:  Allergic contact dermatitis - The patient has been provided detailed information regarding the substances she is sensitive to, as well as products containing the substances.   - Meticulous avoidance of these substances is recommended.  - If avoidance is not possible, the use of barrier creams or lotions is recommended. -  If symptoms persist or progress despite meticulous avoidance of the substances as listed above, a dermatology referral may be warranted. - The sensitivity of patch testing can range from 60-80% and therefore false negative results are  possible. Therefore, this does not definitively rule out contact dermatitis.  - Email sent to address provided with a safe list  Call the clinic if this treatment plan is not working well for you  Follow up in 2 months or sooner if needed.

## 2023-12-28 ENCOUNTER — Encounter: Payer: Self-pay | Admitting: Radiology

## 2024-01-14 ENCOUNTER — Encounter: Payer: Self-pay | Admitting: Neurology

## 2024-01-28 NOTE — Progress Notes (Signed)
 Addendum  MRI brain has been denied due to insurance having incorrect information stating that MRI brain was perform in December 2024. Patient has not had any imaging in December 2024 and the last MRI brain she had was in December 2015.  We will plan to reorder MRI brain and cervical spine wwo contrast.   Al Gagen K. Tobie, DO

## 2024-02-10 NOTE — Progress Notes (Signed)
 Prior Authorization submitted on [employer] and clinical notes have been uploaded. Pending review  Tracking: [id number]

## 2024-02-12 ENCOUNTER — Encounter: Payer: Self-pay | Admitting: Neurology

## 2024-02-12 NOTE — Telephone Encounter (Signed)
 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

## 2024-02-15 ENCOUNTER — Telehealth: Payer: Self-pay | Admitting: Allergy & Immunology

## 2024-02-15 NOTE — Telephone Encounter (Signed)
 Tried getting in contact with patient and called over 3x has not responded to none of the calls to inform her about trying to submit a copy of the letter to [employer].or to bring in a copy to ask Willma since she is with [employer].

## 2024-02-16 ENCOUNTER — Telehealth: Payer: Self-pay

## 2024-02-16 NOTE — Telephone Encounter (Signed)
 Patients MRI Cervical and MRI Brain prior authorization was submitted through [employer]. It is currently in Medical Review and requested updated office notes. I faxed noted from 09/23/2023 however, patients insurance is requesting up to date clinical notes.

## 2024-02-16 NOTE — Telephone Encounter (Signed)
 If insurance is needing updated notes, she will need a follow-up visit.

## 2024-02-17 ENCOUNTER — Other Ambulatory Visit: Payer: Self-pay

## 2024-02-17 DIAGNOSIS — G4485 Primary stabbing headache: Secondary | ICD-10-CM

## 2024-02-17 DIAGNOSIS — R292 Abnormal reflex: Secondary | ICD-10-CM

## 2024-02-17 DIAGNOSIS — R29898 Other symptoms and signs involving the musculoskeletal system: Secondary | ICD-10-CM

## 2024-02-17 NOTE — Telephone Encounter (Signed)
 Upon investigation on [employer] with patient MRI Brain appeal being overturned and approved I found that patients MRI Brain has been approved. Request ID: [id number]. Tracking ID: [id number]. Valid from 01/08/2024-01/07/2025.Will send this information to patient via Mychart.

## 2024-02-20 ENCOUNTER — Ambulatory Visit (HOSPITAL_COMMUNITY)
Admission: RE | Admit: 2024-02-20 | Discharge: 2024-02-20 | Disposition: A | Source: Ambulatory Visit | Attending: Neurology

## 2024-02-20 DIAGNOSIS — R29898 Other symptoms and signs involving the musculoskeletal system: Secondary | ICD-10-CM | POA: Diagnosis present

## 2024-02-20 DIAGNOSIS — R519 Headache, unspecified: Secondary | ICD-10-CM | POA: Diagnosis not present

## 2024-02-20 DIAGNOSIS — G4485 Primary stabbing headache: Secondary | ICD-10-CM | POA: Diagnosis present

## 2024-02-20 DIAGNOSIS — R292 Abnormal reflex: Secondary | ICD-10-CM | POA: Diagnosis not present

## 2024-02-20 MED ORDER — GADOBUTROL 1 MMOL/ML IV SOLN
5.0000 mL | Freq: Once | INTRAVENOUS | Status: AC | PRN
  Administered 2024-02-20: 5 mL via INTRAVENOUS

## 2024-03-01 ENCOUNTER — Encounter: Payer: Self-pay | Admitting: Allergy & Immunology

## 2024-03-01 ENCOUNTER — Ambulatory Visit: Payer: Self-pay | Admitting: Neurology

## 2024-03-11 ENCOUNTER — Telehealth: Payer: Self-pay | Admitting: *Deleted

## 2024-03-11 NOTE — Telephone Encounter (Signed)
 Letter has been faxed to the Clinical Appeal Department at [phone].

## 2024-03-16 NOTE — Telephone Encounter (Signed)
 Awesome thank you!

## 2024-03-22 ENCOUNTER — Encounter: Payer: Self-pay | Admitting: Allergy & Immunology

## 2024-03-22 DIAGNOSIS — Q348 Other specified congenital malformations of respiratory system: Secondary | ICD-10-CM

## 2024-04-11 ENCOUNTER — Ambulatory Visit: Admitting: Obstetrics & Gynecology

## 2024-04-14 ENCOUNTER — Ambulatory Visit: Admitting: Pulmonary Disease

## 2024-05-06 ENCOUNTER — Encounter: Payer: Self-pay | Admitting: Family

## 2024-05-10 ENCOUNTER — Ambulatory Visit: Admitting: Allergy & Immunology
